# Patient Record
Sex: Female | Born: 1985 | Hispanic: Yes | Marital: Single | Smoking: Never smoker
Health system: Southern US, Community
[De-identification: ages and names within clinical notes are randomized; demographics above are authoritative.]

## PROBLEM LIST (undated history)

## (undated) DIAGNOSIS — Z87442 Personal history of urinary calculi: Secondary | ICD-10-CM

## (undated) DIAGNOSIS — J45909 Unspecified asthma, uncomplicated: Secondary | ICD-10-CM

## (undated) DIAGNOSIS — R10A0 Flank pain, unspecified side: Secondary | ICD-10-CM

## (undated) DIAGNOSIS — Z960 Presence of urogenital implants: Secondary | ICD-10-CM

## (undated) DIAGNOSIS — N201 Calculus of ureter: Secondary | ICD-10-CM

## (undated) DIAGNOSIS — Z973 Presence of spectacles and contact lenses: Secondary | ICD-10-CM

## (undated) DIAGNOSIS — R300 Dysuria: Secondary | ICD-10-CM

## (undated) DIAGNOSIS — F32A Depression, unspecified: Secondary | ICD-10-CM

## (undated) DIAGNOSIS — R109 Unspecified abdominal pain: Secondary | ICD-10-CM

## (undated) HISTORY — PX: TUBAL LIGATION: SHX77

---

## 2014-10-09 ENCOUNTER — Emergency Department (HOSPITAL_COMMUNITY): Payer: Medicaid Other

## 2014-10-09 ENCOUNTER — Emergency Department (HOSPITAL_COMMUNITY)
Admission: EM | Admit: 2014-10-09 | Discharge: 2014-10-09 | Disposition: A | Discharge status: Home / Self Care | Payer: Medicaid Other | Attending: Emergency Medicine | Admitting: Emergency Medicine

## 2014-10-09 ENCOUNTER — Encounter (HOSPITAL_COMMUNITY): Payer: Self-pay | Admitting: Emergency Medicine

## 2014-10-09 DIAGNOSIS — R1110 Vomiting, unspecified: Secondary | ICD-10-CM | POA: Diagnosis not present

## 2014-10-09 DIAGNOSIS — Z3202 Encounter for pregnancy test, result negative: Secondary | ICD-10-CM | POA: Diagnosis not present

## 2014-10-09 DIAGNOSIS — R197 Diarrhea, unspecified: Secondary | ICD-10-CM | POA: Diagnosis not present

## 2014-10-09 DIAGNOSIS — R109 Unspecified abdominal pain: Secondary | ICD-10-CM | POA: Diagnosis not present

## 2014-10-09 DIAGNOSIS — M545 Low back pain: Secondary | ICD-10-CM | POA: Diagnosis not present

## 2014-10-09 DIAGNOSIS — Z87442 Personal history of urinary calculi: Secondary | ICD-10-CM | POA: Insufficient documentation

## 2014-10-09 LAB — CBC WITH DIFFERENTIAL/PLATELET
Basophils Absolute: 0 10*3/uL (ref 0.0–0.1)
Basophils Relative: 0 % (ref 0–1)
Eosinophils Absolute: 0.1 10*3/uL (ref 0.0–0.7)
Eosinophils Relative: 1 % (ref 0–5)
HCT: 41.1 % (ref 36.0–46.0)
Hemoglobin: 13.9 g/dL (ref 12.0–15.0)
Lymphocytes Relative: 31 % (ref 12–46)
Lymphs Abs: 2.1 10*3/uL (ref 0.7–4.0)
MCH: 31.2 pg (ref 26.0–34.0)
MCHC: 33.8 g/dL (ref 30.0–36.0)
MCV: 92.2 fL (ref 78.0–100.0)
Monocytes Absolute: 0.4 10*3/uL (ref 0.1–1.0)
Monocytes Relative: 6 % (ref 3–12)
NEUTROS PCT: 62 % (ref 43–77)
Neutro Abs: 4.3 10*3/uL (ref 1.7–7.7)
PLATELETS: 235 10*3/uL (ref 150–400)
RBC: 4.46 MIL/uL (ref 3.87–5.11)
RDW: 12.2 % (ref 11.5–15.5)
WBC: 7 10*3/uL (ref 4.0–10.5)

## 2014-10-09 LAB — URINALYSIS, ROUTINE W REFLEX MICROSCOPIC
BILIRUBIN URINE: NEGATIVE
GLUCOSE, UA: NEGATIVE mg/dL
Ketones, ur: NEGATIVE mg/dL
Nitrite: NEGATIVE
Protein, ur: NEGATIVE mg/dL
Specific Gravity, Urine: 1.021 (ref 1.005–1.030)
Urobilinogen, UA: 0.2 mg/dL (ref 0.0–1.0)
pH: 5.5 (ref 5.0–8.0)

## 2014-10-09 LAB — COMPREHENSIVE METABOLIC PANEL
ALK PHOS: 63 U/L (ref 39–117)
ALT: 24 U/L (ref 0–35)
AST: 19 U/L (ref 0–37)
Albumin: 4 g/dL (ref 3.5–5.2)
Anion gap: 11 (ref 5–15)
BUN: 12 mg/dL (ref 6–23)
CO2: 25 mEq/L (ref 19–32)
Calcium: 9.3 mg/dL (ref 8.4–10.5)
Chloride: 103 mEq/L (ref 96–112)
Creatinine, Ser: 0.65 mg/dL (ref 0.50–1.10)
GFR calc non Af Amer: >90 mL/min (ref >90)
GLUCOSE: 93 mg/dL (ref 70–99)
POTASSIUM: 4.1 meq/L (ref 3.7–5.3)
SODIUM: 139 meq/L (ref 137–147)
TOTAL PROTEIN: 7.7 g/dL (ref 6.0–8.3)
Total Bilirubin: 0.2 mg/dL — ABNORMAL LOW (ref 0.3–1.2)

## 2014-10-09 LAB — URINE MICROSCOPIC-ADD ON

## 2014-10-09 LAB — POC URINE PREG, ED: Preg Test, Ur: NEGATIVE

## 2014-10-09 MED ORDER — ONDANSETRON 4 MG PO TBDP
8.0000 mg | ORAL_TABLET | Freq: Once | ORAL | Status: AC
  Administered 2014-10-09: 8 mg via ORAL
  Filled 2014-10-09: qty 2

## 2014-10-09 MED ORDER — HYDROMORPHONE HCL 1 MG/ML IJ SOLN
1.0000 mg | Freq: Once | INTRAMUSCULAR | Status: AC
  Administered 2014-10-09: 1 mg via INTRAVENOUS
  Filled 2014-10-09: qty 1

## 2014-10-09 MED ORDER — ONDANSETRON HCL 4 MG/2ML IJ SOLN
4.0000 mg | Freq: Once | INTRAMUSCULAR | Status: AC
  Administered 2014-10-09: 4 mg via INTRAVENOUS
  Filled 2014-10-09: qty 2

## 2014-10-09 MED ORDER — OXYCODONE-ACETAMINOPHEN 5-325 MG PO TABS
1.0000 | ORAL_TABLET | Freq: Once | ORAL | Status: AC
  Administered 2014-10-09: 1 via ORAL
  Filled 2014-10-09: qty 1

## 2014-10-09 MED ORDER — CYCLOBENZAPRINE HCL 10 MG PO TABS: 10.0000 mg | ORAL_TABLET | Freq: Two times a day (BID) | ORAL | Status: DC | PRN

## 2014-10-09 MED ORDER — SODIUM CHLORIDE 0.9 % IV BOLUS (SEPSIS)
1000.0000 mL | Freq: Once | INTRAVENOUS | Status: AC
  Administered 2014-10-09: 1000 mL via INTRAVENOUS

## 2014-10-09 MED ORDER — NAPROXEN 500 MG PO TABS: 500.0000 mg | ORAL_TABLET | Freq: Two times a day (BID) | ORAL | Status: DC

## 2014-10-09 MED ORDER — DEXTROSE 5 % IV SOLN
1.0000 g | Freq: Once | INTRAVENOUS | Status: AC
  Administered 2014-10-09: 1 g via INTRAVENOUS
  Filled 2014-10-09: qty 10

## 2014-10-09 NOTE — ED Notes (Signed)
Attempted lab draw x 2 without success. Called phlebotomy.

## 2014-10-09 NOTE — Discharge Instructions (Signed)
°Emergency Department Resource Guide °1) Find a Doctor and Pay Out of Pocket °Although you won't have to find out who is covered by your insurance plan, it is a good idea to ask around and get recommendations. You will then need to call the office and see if the doctor you have chosen will accept you as a new patient and what types of options they offer for patients who are self-pay. Some doctors offer discounts or will set up payment plans for their patients who do not have insurance, but you will need to ask so you aren't surprised when you get to your appointment. ° °2) Contact Your Local Health Department °Not all health departments have doctors that can see patients for sick visits, but many do, so it is worth a call to see if yours does. If you don't know where your local health department is, you can check in your phone book. The CDC also has a tool to help you locate your state's health department, and many state websites also have listings of all of their local health departments. ° °3) Find a Walk-in Clinic °If your illness is not likely to be very severe or complicated, you may want to try a walk in clinic. These are popping up all over the country in pharmacies, drugstores, and shopping centers. They're usually staffed by nurse practitioners or physician assistants that have been trained to treat common illnesses and complaints. They're usually fairly quick and inexpensive. However, if you have serious medical issues or chronic medical problems, these are probably not your best option. ° °No Primary Care Doctor: °- Call [facility] at  832-8000 - they can help you locate a primary care doctor that  accepts your insurance, provides certain services, etc. °- Physician Referral Service- 1-800-533-3463 ° °Chronic Pain Problems: °Organization         Address  Phone   Notes  °[facility]  (336) 297-2271 Patients need to be referred by their primary care doctor.  ° °Medication  Assistance: °Organization         Address  Phone   Notes  °[place] [facility] [address] °[place], [place] [address] (336) 641-8030 --Must be a resident of [place] °-- Must have NO insurance coverage whatsoever (no Medicaid/ Medicare, etc.) °-- The pt. MUST have a primary care doctor that directs their care regularly and follows them in the community °  °[facility]  (866) 331-1348   °[employer]  (888) 892-1162   ° °Agencies that provide inexpensive medical care: °Organization         Address  Phone   Notes  °[facility]  (336) 832-8035   °[facility]    (336) 832-7272   °[facility] [address] °[place], [place] [address] (336) 832-4777   °[facility] [address], °[place] (336) 271-4999   °[facility]    (336) 373-0678   °[facility]    (336) 272-1050   °[facility] ° [address], [place] Phone:  (336) 832-4444, Fax:  (336) 832-4440 Hours of Operation:  9 am - 6 pm, M-F.  Also accepts Medicaid/Medicare and self-pay.  °[facility] ° [address], [place] Phone: (336) 832-3150, Fax: (336) 832-3151. Hours of Operation:  8:30 am - 5:30 pm, M-F.  Also accepts Medicaid and self-pay.  °[facility] [place] [address], [place] Phone: (336) 878-6027   °[facility] [address], [place], [place] (336)723-1848, Ext. 123 Mondays & Thursdays: 7-9 AM.  First 15 patients are seen on a first come, first serve basis. °  ° °Medicaid-accepting [place] Providers: ° °Organization         Address  Phone   Notes  °[facility] [address], [place] (336) 641-2100 Also accepts self-pay patients.  °[facility] [address], [place] ° (336) 856-9996   °[facility] [address], [place]  (336) 288-8857   °[facility] [address] [place] [address], [place] (336) 299-7000   °Veita Bland [address], [place]  ° (336) 373-1557 Only accepts [place] Access Medicaid patients after they have their name applied to their card.  ° °Self-Pay (no insurance) in [place]: ° °Organization         Address  Phone   Notes  °Sickle Cell Patients, [facility] [address], [place] (336) 832-1970   °[facility] [address], [place] (336) 832-4400   °[facility] ° [address], [place] (336) 992-4800   °[facility]/Dr. Osei-Bonsu ° [address] [place] [address], [place] or [address], [place] (336) 841-8500 Phone number for both [place] and [place] locations is the same.  °[facility] [address], [place] (336) 299-0000   °[facility] [address] [place] [address], [place] or [address] (336) 852-7530 °(336) 878-2260   °[facility] [address], [place] (336) 350-1642, phone; (336) 294-5005, fax Sees patients 1st and 3rd Saturday of every month.  Must not qualify for public or private insurance (i.e. Medicaid, Medicare, [place] Health Choice, Veterans' Benefits) • Household income should be no more than 200% of the poverty level •The clinic cannot treat you if you are pregnant or think you are pregnant • Sexually transmitted diseases are not treated at the clinic.  ° ° °Dental Care: °Organization         Address  Phone  Notes  °[place] [facility] [address], [place] (336) 641-6152 Accepts children up to age 21 who are enrolled in Medicaid or [place] Health Choice; pregnant women with a Medicaid card; and children who have applied for Medicaid or [place] Health Choice, but were declined, whose parents can pay a reduced fee at time of service.  °[place]  [facility] [place]  [address], [place] (336) 641-7733 Accepts children up to age 21 who are enrolled in Medicaid or [place] Health Choice; pregnant women with a Medicaid card; and children who have applied for Medicaid or [place] Health Choice, but were declined, whose parents can pay a reduced fee at time of service.  °[facility] ° [address], [place] (336) 641-4533 Patients are seen by appointment only. Walk-ins are not accepted. [facility] will see patients 18 years of age and older. °Monday - Tuesday (8am-5pm) °Most Wednesdays (8:30-5pm) °$30 per visit, cash only  °[facility] ° [address], [place] (336) 641-4533 Patients are seen by appointment only. Walk-ins are not accepted. [facility] will see patients 18 years of age and older. °One   Wednesday Evening (Monthly: Volunteer Based).  $30 per visit, cash only  °[facility]  (919) 537-3737 for adults; Children under age 4, call Graduate Pediatric Dentistry at (919) [phone]. Children aged 4-14, please call (919) [phone] to request a pediatric application. ° Dental services are provided in all areas of dental care including fillings, crowns and bridges, complete and partial dentures, implants, gum treatment, root canals, and extractions. Preventive care is also provided. Treatment is provided to both adults and children. °Patients are selected via a lottery and there is often a waiting list. °  °[facility] [address], °[place] ° (336) 763-8833 [email or web address] °  °[facility] [address], [place], [place] (336)723-1848, Ext. 123 Second and Fourth Thursday of each month, opens at 6:30 AM; Clinic ends at 9 AM.  Patients are seen on a first-come first-served basis, and a limited number are seen during each clinic.  ° °[facility] ° [address], [place], [place] (336) 723-7904    Eligibility Requirements °You must have lived in [place], [place], or [place] counties for at least the last three months. °  You cannot be eligible for state or federal sponsored healthcare insurance, including Veterans Administration, Medicaid, or Medicare. °  You generally cannot be eligible for healthcare insurance through your employer.  °  How to apply: °Eligibility screenings are held every Tuesday and Wednesday afternoon from 1:00 pm until 4:00 pm. You do not need an appointment for the interview!  °[facility] [address], [place], [place] 336-631-2330   °[facility]  336-342-8273   °[facility]  336-703-3100   °[facility]  336-570-6415   ° °Behavioral Health Resources in the Community: °Intensive Outpatient Programs °Organization         Address  Phone  Notes  °[facility] [address], [place], [place] 336-878-6098   °[facility] [address], [place], [place] 336-832-9800   °[facility] [address], [place], [place] ° 336-882-2125   °[facility] [address],  °[place], [place] 1-800-853-5163 or 336-641-4981   °Substance Abuse Resources °Organization         Address  Phone  Notes  °[facility]  336-882-2125   °[facility]  336-784-9470   °[facility]  336-285-9073   °[facility]  336-845-3988   °Residential & Outpatient Substance Abuse Program  1-800-659-3381   °Psychological Services °Organization         Address  Phone  Notes  °[facility]  336- 832-9600   °[facility]  336- 378-7881   °[facility] [address], [place] 1-800-853-5163 or 336-641-4981   ° °Mobile Crisis Teams °Organization         Address  Phone  Notes  °[facility], Mobile Crisis Care Unit  1-877-626-1772   °Assertive °[facility] ° [address]  [place], [place] 336-834-9664   °Sharon DeEsch [address] °[place] 336-554-5454   ° °Self-Help/Support Groups °Organization         Address  Phone             Notes  °[facility]  - variety of support groups  336- 373-1402 Call for more information  °Narcotics Anonymous (NA), [facility] [address], °[place]  2 meetings at this location  ° °  Residential Treatment Programs Organization         Address  Phone  Notes  [facility] [address],    [place]  [phone]   [facility]  [address], [place] [id number], [place], [place] [phone]   [facility] [address], [place] [phone] Admissions: 8am-3pm M-F  [facility] [address]    [place], [place] [phone](743) [phone]   [facility] [address], [place], [place] [phone]   [facility] [address]  [place], [place] [phone]   [facility] Intensive Outpatient [address], [place], [place] [phone]   [facility] [address]  [place], [place] [phone] or (310)330-9328(573) [phone]   [facility] [address] [place], [place] [phone] Accepts Medicaid  [facility] [address]  [place] [phone] Substance Abuse/Addiction Treatment   [facility] Organization         Address  Phone  Notes  [facility]  657-397-8879(888) [phone]   Angie FavaJulie Brannon, PhD [address], [address] [place], [place]   [phone](336) (201) [phone] or (260)[phone](336) ([phone]   [facility]   [address] [place], [place] [phone](336) [phone]   [facility] [address], [place], [place] [phone](336) (925) [phone] Insurance/Medicaid/sponsorship through [facility]  [facility] [address]                                    [place], [place] ([phone](336) ([phone] Therapy/tele-psych/case    [facility] [address]   [place], [place] [phone](336) [phone]    Dr. Lolly MustacheArfeen  [phone]) [phone]   [facility] [place]  [employer] [facility] 1) [address], [place] 2) [address], [place] 3)  [address], [place] [phone](336) [phone](336) [phone]  [phone](336) [phone]   [facility] Child Abuse Hot[facility] 867-024-3576(336) 202-882-0322 or [phone](336) [phone] (After Hours)      Your evaluation of back pain today in the emergency department showed no evidence of an emergent cause of your pain. There is no evidence of kidney stone, broken bones or dislocations. He may follow up with your primary care for further evaluation and management of your back pain

## 2014-10-09 NOTE — ED Provider Notes (Signed)
Patient care transferred to me by Trixie DredgeEmily West, PA-C Plan is to DC with pain symptom relief pending results of CT abdomen.  Patient's CT abdomen shows no evidence of obstructing stone or pyelonephritis. Based on inciting event and story, patient's pain symptomology likely musculoskeletal in etiology. Will DC with pain medicine, muscle relaxer. May follow-up outpatient with primary care for further evaluation and management. Pt can ambulate independently with antalgic gait but no apparent ataxia. Laboratory noncontributory Patient is stable, in good condition and is appropriate for discharge Prior to patient discharge, I discussed and reviewed this case with Dr.Linker     Sharlene MottsBenjamin W Davius Goudeau, PA-C 10/10/14 [phone]

## 2014-10-09 NOTE — ED Notes (Addendum)
Pt reports she vomited. Translator not at bedside. Pt up to bathroom per w/c. Pt appears to be very uncomfortable.

## 2014-10-09 NOTE — ED Provider Notes (Signed)
CSN: [id number]     Arrival date & time 10/09/14  [id number] History  This chart was scribed for non-physician practitioner Trixie DredgeEmily Leinani Lisbon, PA-C  working with Doug SouSam Jacubowitz, MD by Leone PayorSonum Patel, ED Scribe. This patient was seen in room TR11C/TR11C and the patient's care was started at 10:26 AM.    No chief complaint on file.  The history is provided by the patient. No language interpreter was used.    HPI Comments: Jill Riley is a 28 y.o. female who presents to the Emergency Department complaining of 1 day of constant low back pain that radiates to the bilateral hips. She reports associated right lateral side pain along with episodes of vomiting and diarrhea. She denies recent injury or heavy lifting.  Does note the pain began while she was mopping.She denies fever.  Denies urinary or vaginal symptoms. LMP Oct 22.   She does have a hx kidney stones.  Denies current vaginal bleeding.  Pt has similar symptoms [place] ago [place], had pain for 2 weeks and it resolved.    History reviewed. No pertinent past medical history. History reviewed. No pertinent past surgical history. No family history on file. History  Substance Use Topics  . Smoking status: Never Smoker   . Smokeless tobacco: Not on file  . Alcohol Use: No   OB History   Grav Para Term Preterm Abortions TAB SAB Ect Mult Living                 Review of Systems  All other systems reviewed and are negative.     Allergies  Review of patient's allergies indicates no known allergies.  Home Medications   Prior to Admission medications   Not on File   BP 112/61  Pulse 89  Temp(Src) 98.4 F (36.9 C) (Oral)  SpO2 100%  LMP 10/05/2014 Physical Exam  Nursing note and vitals reviewed. Constitutional: She appears well-developed and well-nourished. No distress.  HENT:  Head: Normocephalic and atraumatic.  Neck: Neck supple.  Cardiovascular: Normal rate and regular rhythm.   Pulmonary/Chest: Effort normal and  breath sounds normal. No respiratory distress. She has no wheezes. She has no rales.  Abdominal: Soft. She exhibits no distension. There is no tenderness. There is no rebound, no guarding and no CVA tenderness.  Musculoskeletal:       Arms: Neurological: She is alert.  Skin: She is not diaphoretic.    ED Course  Procedures (including critical care time)  DIAGNOSTIC STUDIES: Oxygen Saturation is 100% on RA, normal by my interpretation.    COORDINATION OF CARE: 10:26 AM Discussed treatment plan with pt at bedside and pt agreed to plan.   Labs Review Labs Reviewed  COMPREHENSIVE METABOLIC PANEL - Abnormal; Notable for the following:    Total Bilirubin 0.2 (*)    All other components within normal limits  URINALYSIS, ROUTINE W REFLEX MICROSCOPIC - Abnormal; Notable for the following:    APPearance HAZY (*)    Hgb urine dipstick SMALL (*)    Leukocytes, UA MODERATE (*)    All other components within normal limits  URINE MICROSCOPIC-ADD ON - Abnormal; Notable for the following:    Squamous Epithelial / LPF MANY (*)    Bacteria, UA FEW (*)    All other components within normal limits  URINE CULTURE  CBC WITH DIFFERENTIAL  POC URINE PREG, ED    Imaging Review No results found.   EKG Interpretation None      1:01 PM Signed out  to LandAmerica FinancialBen Cartner, PA-C as patient moved back to main ED.  Pending pain control, CT abd/pelvis.  Suspect muscle strain vs pyelo vs kidney stone.    MDM   Final diagnoses:  Right flank pain      I personally performed the services described in this documentation, which was scribed in my presence. The recorded information has been reviewed and is accurate.   Trixie Dredgemily Emiley Digiacomo, PA-C 10/09/14 1307

## 2014-10-09 NOTE — ED Provider Notes (Signed)
Medical screening examination/treatment/procedure(s) were performed by non-physician practitioner and as supervising physician I was immediately available for consultation/collaboration.   EKG Interpretation None       Doug SouSam Christino Mcglinchey, MD 10/09/14 1725

## 2014-10-09 NOTE — ED Provider Notes (Signed)
Medical Screening Exam  Pt complaining of 1 day of constant low back pain that radiates to the bilateral hips. She reports associated right flank pain along with episodes of vomiting and diarrhea.Does note some tenderness along right abdomen. She denies recent falls or heavy lifting, pain did begin while mopping.   Pain is preventing her from standing upright and walking comfortably.     Pt triaged to fast track.  As she is having right flank pain and N/V/D, will move to main ED.  Ordered labs, urine.    Trixie Dredgemily Priscilla Kirstein, PA-C 10/09/14 1035

## 2014-10-09 NOTE — ED Provider Notes (Signed)
Medical screening examination/treatment/procedure(s) were performed by non-physician practitioner and as supervising physician I was immediately available for consultation/collaboration.   EKG Interpretation None       Ros[place] Braun, MD 10/09/14 1726 

## 2014-10-09 NOTE — ED Notes (Signed)
Pt here from [place] 3 weeks ago. Today c/o LBP. States is unable to walk due to pain. Hx of same 9 months ago. Had neg xray per pt. Denies N/V or vaginal discharge. Pt does not speak [place]. Family member at bedside that does.

## 2014-10-10 LAB — URINE CULTURE: Colony Count: 30000

## 2014-10-11 NOTE — ED Provider Notes (Signed)
Medical screening examination/treatment/procedure(s) were performed by non-physician practitioner and as supervising physician I was immediately available for consultation/collaboration.   EKG Interpretation None       Nandan Willems K Linker, MD 10/11/14 0803 

## 2016-07-08 ENCOUNTER — Encounter (HOSPITAL_COMMUNITY): Payer: Self-pay | Admitting: Emergency Medicine

## 2016-07-08 ENCOUNTER — Emergency Department (HOSPITAL_COMMUNITY)
Admission: EM | Admit: 2016-07-08 | Discharge: 2016-07-09 | Disposition: A | Discharge status: Home / Self Care | Payer: Medicaid Other | Attending: Emergency Medicine | Admitting: Emergency Medicine

## 2016-07-08 DIAGNOSIS — M545 Low back pain: Secondary | ICD-10-CM | POA: Insufficient documentation

## 2016-07-08 DIAGNOSIS — R1030 Lower abdominal pain, unspecified: Secondary | ICD-10-CM | POA: Diagnosis not present

## 2016-07-08 DIAGNOSIS — N390 Urinary tract infection, site not specified: Secondary | ICD-10-CM | POA: Insufficient documentation

## 2016-07-08 LAB — URINALYSIS, ROUTINE W REFLEX MICROSCOPIC
BILIRUBIN URINE: NEGATIVE
Glucose, UA: NEGATIVE mg/dL
KETONES UR: NEGATIVE mg/dL
NITRITE: NEGATIVE
Protein, ur: NEGATIVE mg/dL
Specific Gravity, Urine: 1.025 (ref 1.005–1.030)
pH: 5.5 (ref 5.0–8.0)

## 2016-07-08 LAB — COMPREHENSIVE METABOLIC PANEL
ALK PHOS: 56 U/L (ref 38–126)
ALT: 33 U/L (ref 14–54)
AST: 28 U/L (ref 15–41)
Albumin: 4.1 g/dL (ref 3.5–5.0)
Anion gap: 3 — ABNORMAL LOW (ref 5–15)
BILIRUBIN TOTAL: 0.7 mg/dL (ref 0.3–1.2)
BUN: 12 mg/dL (ref 6–20)
CO2: 25 mmol/L (ref 22–32)
CREATININE: 0.85 mg/dL (ref 0.44–1.00)
Calcium: 9.3 mg/dL (ref 8.9–10.3)
Chloride: 107 mmol/L (ref 101–111)
GFR calc Af Amer: >60 mL/min (ref >60)
GFR calc non Af Amer: >60 mL/min (ref >60)
Glucose, Bld: 92 mg/dL (ref 65–99)
POTASSIUM: 3.9 mmol/L (ref 3.5–5.1)
Sodium: 135 mmol/L (ref 135–145)
TOTAL PROTEIN: 7.2 g/dL (ref 6.5–8.1)

## 2016-07-08 LAB — URINE MICROSCOPIC-ADD ON

## 2016-07-08 LAB — CBC
HCT: 43.3 % (ref 36.0–46.0)
Hemoglobin: 15 g/dL (ref 12.0–15.0)
MCH: 31.9 pg (ref 26.0–34.0)
MCHC: 34.6 g/dL (ref 30.0–36.0)
MCV: 92.1 fL (ref 78.0–100.0)
Platelets: 250 10*3/uL (ref 150–400)
RBC: 4.7 MIL/uL (ref 3.87–5.11)
RDW: 11.9 % (ref 11.5–15.5)
WBC: 8.5 10*3/uL (ref 4.0–10.5)

## 2016-07-08 LAB — I-STAT BETA HCG BLOOD, ED (MC, WL, AP ONLY): I-stat hCG, quantitative: <5 m[IU]/mL (ref <5)

## 2016-07-08 LAB — LIPASE, BLOOD: Lipase: 22 U/L (ref 11–51)

## 2016-07-08 MED ORDER — CYCLOBENZAPRINE HCL 10 MG PO TABS: 10.0000 mg | ORAL_TABLET | Freq: Two times a day (BID) | ORAL | 0 refills | Status: DC | PRN

## 2016-07-08 MED ORDER — NITROFURANTOIN MONOHYD MACRO 100 MG PO CAPS: 100.0000 mg | ORAL_CAPSULE | Freq: Two times a day (BID) | ORAL | 0 refills | Status: DC

## 2016-07-08 MED ORDER — KETOROLAC TROMETHAMINE 60 MG/2ML IM SOLN
30.0000 mg | Freq: Once | INTRAMUSCULAR | Status: AC
  Administered 2016-07-08: 30 mg via INTRAMUSCULAR
  Filled 2016-07-08: qty 2

## 2016-07-08 MED ORDER — NAPROXEN 500 MG PO TABS: 500.0000 mg | ORAL_TABLET | Freq: Two times a day (BID) | ORAL | 0 refills | Status: DC

## 2016-07-08 MED ORDER — DEXAMETHASONE SODIUM PHOSPHATE 10 MG/ML IJ SOLN
10.0000 mg | Freq: Once | INTRAMUSCULAR | Status: AC
  Administered 2016-07-08: 10 mg via INTRAMUSCULAR
  Filled 2016-07-08: qty 1

## 2016-07-08 NOTE — ED Triage Notes (Signed)
Pt sts left sided flank and back pain x 3 days; pt denies other symptoms at present; pt sts LMP was 6/27

## 2016-07-08 NOTE — ED Provider Notes (Signed)
[facility]-EMERGENCY DEPT Provider Note   CSN: [id number] Arrival date & time: 07/08/16  8295  First Provider Contact:  First MD Initiated Contact with Patient 07/08/16 2225        History   Chief Complaint Chief Complaint  Patient presents with  . Abdominal Pain    HPI Jill Riley is a 30 y.o. female.  The history is provided by the patient. A language interpreter was used.     Patient is a 30 year old female with history of chronic back pain who presents with 3 days of worsening left-sided lower back pain. Patient states this pain is similar to episodes she's had in the past. Pain in the left-sided lower back radiates into her lateral left hip and thigh. Pain is sharp 6/10 at rest and worse with movement, 8/10. She isn't taking anything for the pain. She is also having left flank pain that feels like menstrual cramps, relieved with urination. Associated nausea and decreased urine. Patient denies fever, chills, dysuria, increased frequency, vaginal discharge, disappearing area, history of STDs, headache, dizziness, numbness/timing, weakness, loss of bowel bladder function.  History reviewed. No pertinent past medical history.  There are no active problems to display for this patient.   Past Surgical History:  Procedure Laterality Date  . TUBAL LIGATION      OB History    No data available       Home Medications    Prior to Admission medications   Medication Sig Start Date End Date Taking? Authorizing Provider  cyclobenzaprine (FLEXERIL) 10 MG tablet Take 1 tablet (10 mg total) by mouth 2 (two) times daily as needed for muscle spasms. 07/08/16   Jerre Simon, PA  naproxen (NAPROSYN) 500 MG tablet Take 1 tablet (500 mg total) by mouth 2 (two) times daily. 07/08/16   Jerre Simon, PA  nitrofurantoin, macrocrystal-monohydrate, (MACROBID) 100 MG capsule Take 1 capsule (100 mg total) by mouth 2 (two) times daily. 07/08/16   Jerre Simon, PA    Family  History History reviewed. No pertinent family history.  Social History Social History  Substance Use Topics  . Smoking status: Never Smoker  . Smokeless tobacco: Not on file  . Alcohol use No     Allergies   Review of patient's allergies indicates no known allergies.   Review of Systems Review of Systems  Constitutional: Negative for chills and fever.  Respiratory: Negative for shortness of breath.   Cardiovascular: Negative for chest pain and leg swelling.  Gastrointestinal: Positive for nausea. Negative for abdominal pain, diarrhea and vomiting.  Genitourinary: Positive for decreased urine volume and flank pain. Negative for difficulty urinating, dyspareunia, dysuria, frequency and vaginal discharge.  Musculoskeletal: Positive for back pain. Negative for joint swelling and neck pain.  Skin: Negative for rash.  Neurological: Negative for dizziness, syncope, weakness, numbness and headaches.     Physical Exam Updated Vital Signs BP 126/80 (BP Location: Left Arm)   Pulse 72   Temp 98.5 F (36.9 C) (Oral)   Resp 26   SpO2 99%   Physical Exam  Constitutional: She appears well-developed and well-nourished. No distress.  HENT:  Head: Normocephalic and atraumatic.  Eyes: Conjunctivae are normal.  Neck: Normal range of motion.  Cardiovascular: Normal rate, regular rhythm and normal heart sounds.  Exam reveals no gallop and no friction rub.   No murmur heard. Pulses:      Dorsalis pedis pulses are 2+ on the right side, and 2+ on the left side.  Posterior tibial pulses are 2+ on the right side, and 2+ on the left side.  Pulmonary/Chest: Effort normal and breath sounds normal. No respiratory distress. She has no wheezes. She has no rales.  Abdominal: Soft. Normal appearance. She exhibits no distension. There is tenderness in the suprapubic area. There is no CVA tenderness.  Musculoskeletal: She exhibits no edema.       Lumbar back: Normal. She exhibits normal range of  motion, no swelling, no edema and no deformity.  TTP to the paraspinal muscles of lumbar spine, greater on the left, TTP of the left sided SI joint, no TTP to the lumbar spine. Strength 5/5 of BLE including plantar flexion and extension, no pain with straight leg raise, sensation intact to light touch, patient neurovascularly intact distally.  Neurological: She is alert.  Skin: Skin is warm and dry. No rash noted. She is not diaphoretic.  Psychiatric: She has a normal mood and affect. Her behavior is normal.  Nursing note and vitals reviewed.     ED Treatments / Results  Labs (all labs ordered are listed, but only abnormal results are displayed) Labs Reviewed  COMPREHENSIVE METABOLIC PANEL - Abnormal; Notable for the following:       Result Value   Anion gap 3 (*)    All other components within normal limits  URINALYSIS, ROUTINE W REFLEX MICROSCOPIC (NOT AT [facility]) - Abnormal; Notable for the following:    APPearance TURBID (*)    Hgb urine dipstick MODERATE (*)    Leukocytes, UA LARGE (*)    All other components within normal limits  URINE MICROSCOPIC-ADD ON - Abnormal; Notable for the following:    Squamous Epithelial / LPF 6-30 (*)    Bacteria, UA MANY (*)    All other components within normal limits  LIPASE, BLOOD  CBC  I-STAT BETA HCG BLOOD, ED ([facility], [facility], [facility] ONLY)    EKG  EKG Interpretation None       Radiology No results found.  Procedures Procedures (including critical care time)  Medications Ordered in ED Medications  dexamethasone (DECADRON) injection 10 mg (not administered)  ketorolac (TORADOL) injection 30 mg (not administered)     Initial Impression / Assessment and Plan / ED Course  I have reviewed the triage vital signs and the nursing notes.  Pertinent labs & imaging results that were available during my care of the patient were reviewed by me and considered in my medical decision making (see chart for details).  Clinical Course    Final  Clinical Impressions(s) / ED Diagnoses   Final diagnoses:  Bilateral low back pain, with sciatica presence unspecified  UTI (lower urinary tract infection)    Patient with back pain similar to episodes in the past.  No neurological deficits and normal neuro exam.  Patient can walk but states is painful.  No loss of bowel or bladder control.  No concern for cauda equina.  No fever, night sweats, weight loss, h/o cancer, IVDU.  RICE protocol and pain medicine indicated and discussed with patient. Pt given IM toradol in the ED and d/c with pain meds and flexeril.   Pt has been diagnosed with a UTI. Pt is afebrile, no CVA tenderness, normotensive, and denies V. Pt to be dc home with antibiotics and instructions to follow up with PCP if symptoms persist. Urine culture pending.   Discussed strict return precautions. Pt expressed understanding to the discharge instructions.    New Prescriptions New Prescriptions   NITROFURANTOIN, MACROCRYSTAL-MONOHYDRATE, (MACROBID) 100  MG CAPSULE    Take 1 capsule (100 mg total) by mouth 2 (two) times daily.     Jerre Simon, PA 07/09/16 0011    Rolan Bucco, MD 07/09/16 [phone]

## 2016-07-08 NOTE — Discharge Instructions (Signed)
Obtain a primary care provider and follow up with them regarding your chronic back pain. Take the medications as prescribed. Be sure to eat when taking the naproxen as this can be hard on your stomach. Do not drive or operate machinery while on the Flexeril. Do not drink alcohol while taking Flexeril as it can cause drowsiness. Return to the emergency department if you experience worsening back pain, fevers, numbness/tingling of your legs, loss of bowel or bladder function or any other concerning symptoms.

## 2016-07-08 NOTE — ED Notes (Addendum)
PA at bedside with portable spanish interpreter in use.

## 2016-07-09 NOTE — ED Notes (Signed)
Pt verbalized understanding of discharge instructions and follow-up care. No further questions at this time. 

## 2017-04-10 ENCOUNTER — Encounter (HOSPITAL_COMMUNITY): Payer: Self-pay | Admitting: Emergency Medicine

## 2017-04-10 ENCOUNTER — Emergency Department (HOSPITAL_COMMUNITY)
Admission: EM | Admit: 2017-04-10 | Discharge: 2017-04-10 | Disposition: A | Discharge status: Home / Self Care | Payer: Medicaid Other | Attending: Emergency Medicine | Admitting: Emergency Medicine

## 2017-04-10 DIAGNOSIS — Z5321 Procedure and treatment not carried out due to patient leaving prior to being seen by health care provider: Secondary | ICD-10-CM | POA: Insufficient documentation

## 2017-04-10 DIAGNOSIS — R109 Unspecified abdominal pain: Secondary | ICD-10-CM | POA: Insufficient documentation

## 2017-04-10 LAB — CBC
HEMATOCRIT: 39.3 % (ref 36.0–46.0)
Hemoglobin: 13.7 g/dL (ref 12.0–15.0)
MCH: 31.1 pg (ref 26.0–34.0)
MCHC: 34.9 g/dL (ref 30.0–36.0)
MCV: 89.3 fL (ref 78.0–100.0)
Platelets: 231 10*3/uL (ref 150–400)
RBC: 4.4 MIL/uL (ref 3.87–5.11)
RDW: 12 % (ref 11.5–15.5)
WBC: 7.7 10*3/uL (ref 4.0–10.5)

## 2017-04-10 LAB — URINALYSIS, ROUTINE W REFLEX MICROSCOPIC
Bilirubin Urine: NEGATIVE
GLUCOSE, UA: NEGATIVE mg/dL
Ketones, ur: NEGATIVE mg/dL
NITRITE: NEGATIVE
PROTEIN: NEGATIVE mg/dL
Specific Gravity, Urine: 1.013 (ref 1.005–1.030)
pH: 5 (ref 5.0–8.0)

## 2017-04-10 LAB — BASIC METABOLIC PANEL
ANION GAP: 8 (ref 5–15)
BUN: 9 mg/dL (ref 6–20)
CO2: 22 mmol/L (ref 22–32)
CREATININE: 0.59 mg/dL (ref 0.44–1.00)
Calcium: 9 mg/dL (ref 8.9–10.3)
Chloride: 104 mmol/L (ref 101–111)
GFR calc non Af Amer: >60 mL/min (ref >60)
Glucose, Bld: 86 mg/dL (ref 65–99)
Potassium: 4.2 mmol/L (ref 3.5–5.1)
Sodium: 134 mmol/L — ABNORMAL LOW (ref 135–145)

## 2017-04-10 LAB — I-STAT BETA HCG BLOOD, ED (MC, WL, AP ONLY): I-stat hCG, quantitative: <5 m[IU]/mL (ref <5)

## 2017-04-10 NOTE — ED Triage Notes (Signed)
Spanish interpretor utilized for triage. Pt reports left sided flank pain x1 year denies any other associated symptoms.

## 2017-04-10 NOTE — ED Notes (Signed)
Called for room without answer.  

## 2017-04-11 ENCOUNTER — Encounter (HOSPITAL_BASED_OUTPATIENT_CLINIC_OR_DEPARTMENT_OTHER): Payer: Self-pay | Admitting: Emergency Medicine

## 2017-04-11 ENCOUNTER — Emergency Department (HOSPITAL_BASED_OUTPATIENT_CLINIC_OR_DEPARTMENT_OTHER)
Admission: EM | Admit: 2017-04-11 | Discharge: 2017-04-11 | Disposition: A | Discharge status: Home / Self Care | Payer: Worker's Compensation | Attending: Emergency Medicine | Admitting: Emergency Medicine

## 2017-04-11 DIAGNOSIS — Y990 Civilian activity done for income or pay: Secondary | ICD-10-CM | POA: Diagnosis not present

## 2017-04-11 DIAGNOSIS — W260XXA Contact with knife, initial encounter: Secondary | ICD-10-CM | POA: Diagnosis not present

## 2017-04-11 DIAGNOSIS — S6992XA Unspecified injury of left wrist, hand and finger(s), initial encounter: Secondary | ICD-10-CM | POA: Diagnosis present

## 2017-04-11 DIAGNOSIS — Y929 Unspecified place or not applicable: Secondary | ICD-10-CM | POA: Diagnosis not present

## 2017-04-11 DIAGNOSIS — S61012A Laceration without foreign body of left thumb without damage to nail, initial encounter: Secondary | ICD-10-CM

## 2017-04-11 DIAGNOSIS — S66222A Laceration of extensor muscle, fascia and tendon of left thumb at wrist and hand level, initial encounter: Secondary | ICD-10-CM | POA: Insufficient documentation

## 2017-04-11 DIAGNOSIS — Y9389 Activity, other specified: Secondary | ICD-10-CM | POA: Insufficient documentation

## 2017-04-11 DIAGNOSIS — Z23 Encounter for immunization: Secondary | ICD-10-CM | POA: Diagnosis not present

## 2017-04-11 MED ORDER — TETANUS-DIPHTH-ACELL PERTUSSIS 5-2.5-18.5 LF-MCG/0.5 IM SUSP
0.5000 mL | Freq: Once | INTRAMUSCULAR | Status: AC
  Administered 2017-04-11: 0.5 mL via INTRAMUSCULAR
  Filled 2017-04-11: qty 0.5

## 2017-04-11 MED ORDER — LIDOCAINE HCL (PF) 1 % IJ SOLN
INTRAMUSCULAR | Status: AC
  Administered 2017-04-11: 5 mL via INTRADERMAL
  Filled 2017-04-11: qty 5

## 2017-04-11 MED ORDER — LIDOCAINE HCL (PF) 1 % IJ SOLN
5.0000 mL | Freq: Once | INTRAMUSCULAR | Status: AC
  Administered 2017-04-11: 5 mL via INTRADERMAL

## 2017-04-11 NOTE — ED Provider Notes (Signed)
[facility]   CSN: [id number] Arrival date & time: 04/11/17  0240     History   Chief Complaint Chief Complaint  Patient presents with  . Extremity Laceration    HPI Jill Riley is a 31 y.o. female.  The history is provided by the patient and a friend. A language interpreter was used [phone]).  Laceration   Incident onset: just prior to arrival. Pain location: left thumb. The laceration is 2 cm in size. Injury mechanism: knife. The pain is mild. The pain has been constant since onset. Her tetanus status is unknown.  pt comes from work where she sustained laceration to left thumb No other injuries or complaints reported   PMH - none Past Surgical History:  Procedure Laterality Date  . TUBAL LIGATION      OB History    No data available       Home Medications    Prior to Admission medications   Medication Sig Start Date End Date Taking? Authorizing Provider  cyclobenzaprine (FLEXERIL) 10 MG tablet Take 1 tablet (10 mg total) by mouth 2 (two) times daily as needed for muscle spasms. 07/08/16   Jerre Simon, PA  naproxen (NAPROSYN) 500 MG tablet Take 1 tablet (500 mg total) by mouth 2 (two) times daily. 07/08/16   Jerre Simon, PA  nitrofurantoin, macrocrystal-monohydrate, (MACROBID) 100 MG capsule Take 1 capsule (100 mg total) by mouth 2 (two) times daily. 07/08/16   Jerre Simon, PA    Family History No family history on file.  Social History Social History  Substance Use Topics  . Smoking status: Never Smoker  . Smokeless tobacco: Never Used  . Alcohol use No     Allergies   Patient has no known allergies.   Review of Systems Review of Systems  Constitutional: Negative for fever.  Skin: Positive for wound.     Physical Exam Updated Vital Signs BP (!) 144/87 (BP Location: Right Arm)   Pulse 96   Temp 98.2 F (36.8 C) (Oral)   Resp 16   SpO2 100%   Physical Exam CONSTITUTIONAL: Well developed/well  nourished HEAD: Normocephalic/atraumatic ENMT: Mucous membranes moist NECK: supple no meningeal signs LUNGS:  no apparent distress NEURO: Pt is awake/alert/appropriate, moves all extremitiesx4.  No facial droop.   EXTREMITIES: pulses normal/equal, full ROM, clean 2 cm laceration to extensor surface/proximal aspect of left thumb. No foreign body noted.  Extensor Tendon is visualized and appears lacerated She has full range of motion of left thumb with flexion/extension and equal strength with movement of left thumb Wound is hemostatic SKIN: warm, color normal, laceration noted PSYCH: no abnormalities of mood noted, alert and oriented to situation   ED Treatments / Results  Labs (all labs ordered are listed, but only abnormal results are displayed) Labs Reviewed - No data to display  EKG  EKG Interpretation None       Radiology No results found.  Procedures Procedures  LACERATION REPAIR Performed by: Joya Gaskins Consent: Verbal consent obtained Prepped and Draped in normal sterile fashion Wound explored Laceration Location: left thumb - extensor surface Laceration Length: 2cm No Foreign Bodies seen or palpated Anesthesia: local infiltration Local anesthetic: lidocaine % without epinephrine Anesthetic total: 5 ml Irrigation method: syringe Amount of cleaning: standard Skin closure: simple Number of sutures or staples: 3 prolene Technique: interrupted Patient tolerance: Patient tolerated the procedure well with no immediate complications.  SPLINT APPLICATION Date/Time: 04/11/17 Authorized by: Joya Gaskins Consent: Verbal consent  obtained. Risks and benefits: risks, benefits and alternatives were discussed Consent given by: patient Splint applied by: orthopedic technician Location details: left upper extremity Splint type: thumb spica Supplies used: ortho glass Post-procedure: The splinted body part was neurovascularly unchanged following the  procedure. Patient tolerance: Patient tolerated the procedure well with no immediate complications.     Medications Ordered in ED Medications  Tdap (BOOSTRIX) injection 0.5 mL (0.5 mLs Intramuscular Given 04/11/17 0321)  lidocaine (PF) (XYLOCAINE) 1 % injection 5 mL (5 mLs Intradermal Given by Other 04/11/17 4696)     Initial Impression / Assessment and Plan / ED Course  I have reviewed the triage vital signs and the nursing notes.       Pt appears to have at least partial extensor tendon laceration to left thumb, however she does not appear to have any weakness with extension of thumb I loosely closed wound, and applied thumb spica I explained at length via spanish interpreter (iPad version) need to avoid work, use splint and close followup with hand specialist for re-examination and potential tendon repair Wound is clean, no foreign bodies noted Pt agreeable with plan  Final Clinical Impressions(s) / ED Diagnoses   Final diagnoses:  Laceration of left thumb without foreign body without damage to nail, initial encounter  Laceration of left thumb with tendon involvement, initial encounter    New Prescriptions New Prescriptions   No medications on file     Zadie Rhine, MD 04/11/17 [phone]

## 2017-04-11 NOTE — ED Notes (Signed)
MD at bedside with Ipad interpreter to explain instructions to pt.

## 2017-04-11 NOTE — ED Triage Notes (Addendum)
Pt has laceration to base of left thumb from a knife while at work tonight. Pt has friend with her interpreting.

## 2017-04-11 NOTE — ED Notes (Signed)
Pt tolerated well. PMS intact before and after. All questions answered. 

## 2017-04-13 ENCOUNTER — Emergency Department (HOSPITAL_BASED_OUTPATIENT_CLINIC_OR_DEPARTMENT_OTHER)
Admission: EM | Admit: 2017-04-13 | Discharge: 2017-04-13 | Disposition: A | Discharge status: Home / Self Care | Payer: Self-pay | Attending: Emergency Medicine | Admitting: Emergency Medicine

## 2017-04-13 ENCOUNTER — Encounter (HOSPITAL_BASED_OUTPATIENT_CLINIC_OR_DEPARTMENT_OTHER): Payer: Self-pay

## 2017-04-13 DIAGNOSIS — Z5189 Encounter for other specified aftercare: Secondary | ICD-10-CM

## 2017-04-13 DIAGNOSIS — I10 Essential (primary) hypertension: Secondary | ICD-10-CM | POA: Insufficient documentation

## 2017-04-13 DIAGNOSIS — X58XXXD Exposure to other specified factors, subsequent encounter: Secondary | ICD-10-CM | POA: Insufficient documentation

## 2017-04-13 DIAGNOSIS — S61012D Laceration without foreign body of left thumb without damage to nail, subsequent encounter: Secondary | ICD-10-CM | POA: Insufficient documentation

## 2017-04-13 NOTE — ED Notes (Signed)
EMT at [facility] applying splint

## 2017-04-13 NOTE — ED Triage Notes (Signed)
Pt requesting recheck of left UE splint that was placed here 4/28-also states she did not receive RTW note-r/t to [facility] claim-staff interpreter used in triage-interpreter line to be used in tx area for complete confirmation-pt NAD-steady gait

## 2017-04-13 NOTE — ED Notes (Signed)
Splint removed, site/ wound assessed. Closed laceration approximated well, sutures intact, wound healing/ healthy, no s/sx of infection. No drainage. Minimal swelling present. CMS intact. Pulses strong. Cap refill <2sec. ROM decreased d/t pain, thumb weakness and tenderness. Severe TTP to wound and posterior thumb.

## 2017-04-13 NOTE — ED Provider Notes (Signed)
[facility]   CSN: [id number] Arrival date & time: 04/13/17  1851  By signing my name below, I, Freida Busman, attest that this documentation has been prepared under the direction and in the presence of Roxy Horseman, PA-C. Electronically Signed: Freida Busman, Scribe. 04/13/2017. 8:25 PM.  History   Chief Complaint Chief Complaint  Patient presents with  . Follow-up   The history is provided by the patient. A language interpreter was used ([place]).   HPI Comments:  Jill Riley is a 31 y.o. female who presents to the Emergency Department for re-evaluation of LUE which is currently in a splint. Pt was seen in the ED on 04/11/2017 for a laceration to the left thumb. She had the wound repaired and a thumb spica placed. She was instructed to follow up with specialist for re-examination.  Pt states her job told her not to make any appointments as they're going to send her to a doctor to follow up with. She has been told she cannot return to work unless she can use both hands but her job wants to return despite this. Pt has no acute complaints at this time.   History reviewed. No pertinent past medical history.  There are no active problems to display for this patient.   Past Surgical History:  Procedure Laterality Date  . TUBAL LIGATION      OB History    No data available       Home Medications    Prior to Admission medications   Not on File    Family History No family history on file.  Social History Social History  Substance Use Topics  . Smoking status: Never Smoker  . Smokeless tobacco: Never Used  . Alcohol use No     Allergies   Patient has no known allergies.   Review of Systems Review of Systems  Constitutional: Negative for fever.  Musculoskeletal: Positive for myalgias.  Skin: Positive for wound.   Physical Exam Updated Vital Signs BP (!) 135/101 (BP Location: Left Arm)   Pulse 84   Temp 98.6 F (37 C)  (Oral)   Resp 20   Wt 190 lb (86.2 kg)   LMP  (LMP Unknown)   SpO2 100%   BMI 33.66 kg/m   Physical Exam  Constitutional: She is oriented to person, place, and time. She appears well-developed and well-nourished. No distress.  HENT:  Head: Normocephalic and atraumatic.  Eyes: Conjunctivae are normal.  Cardiovascular: Normal rate.   Pulmonary/Chest: Effort normal.  Abdominal: She exhibits no distension.  Musculoskeletal:  Left thumb laceration; sutures intact, no evidence of infection or abscess  Intact distal pulses  Brisk cap refill  Sensation intact   Neurological: She is alert and oriented to person, place, and time.  Skin: Skin is warm and dry.  Psychiatric: She has a normal mood and affect. Thought content normal.  Nursing note and vitals reviewed.    ED Treatments / Results  DIAGNOSTIC STUDIES:  Oxygen Saturation is 100% on RA, normal by my interpretation.    COORDINATION OF CARE:  8:24 PM Discussed treatment plan with pt at bedside and pt agreed to plan.  Labs (all labs ordered are listed, but only abnormal results are displayed) Labs Reviewed - No data to display  EKG  EKG Interpretation None       Radiology No results found.  Procedures Procedures (including critical care time)  Medications Ordered in ED Medications - No data to display   Initial  Impression / Assessment and Plan / ED Course  I have reviewed the triage vital signs and the nursing notes.  Pertinent labs & imaging results that were available during my care of the patient were reviewed by me and considered in my medical decision making (see chart for details).     Patient here for wound check.  Wound is healing well.  No sign of infection.  Patient mainly concerned about follow-up and work note.  Recommend that she not return to work until seen by hand as originally directed.  Patient understands and agrees with the plan.  She will contact her employer.  Final Clinical  Impressions(s) / ED Diagnoses   Final diagnoses:  Visit for wound check    New Prescriptions There are no discharge medications for this patient.  I personally performed the services described in this documentation, which was scribed in my presence. The recorded information has been reviewed and is accurate.      Roxy Horseman, PA-C 04/13/17 2254    Melene Plan, DO 04/13/17 2350

## 2017-04-16 ENCOUNTER — Other Ambulatory Visit: Payer: Self-pay | Admitting: Orthopedic Surgery

## 2017-04-17 ENCOUNTER — Encounter (HOSPITAL_BASED_OUTPATIENT_CLINIC_OR_DEPARTMENT_OTHER): Payer: Self-pay | Admitting: *Deleted

## 2017-04-20 NOTE — Pre-Procedure Instructions (Signed)
Marlene will be interpreter for pt., per Judy at [facility]; please call [phone] if surgery time changes. 

## 2017-04-21 ENCOUNTER — Ambulatory Visit (HOSPITAL_BASED_OUTPATIENT_CLINIC_OR_DEPARTMENT_OTHER): Payer: Worker's Compensation | Admitting: Anesthesiology

## 2017-04-21 ENCOUNTER — Encounter (HOSPITAL_BASED_OUTPATIENT_CLINIC_OR_DEPARTMENT_OTHER)
Admission: RE | Disposition: A | Discharge status: Home / Self Care | Payer: Self-pay | Source: Ambulatory Visit | Attending: Orthopedic Surgery

## 2017-04-21 ENCOUNTER — Encounter (HOSPITAL_BASED_OUTPATIENT_CLINIC_OR_DEPARTMENT_OTHER): Payer: Self-pay | Admitting: *Deleted

## 2017-04-21 ENCOUNTER — Ambulatory Visit (HOSPITAL_BASED_OUTPATIENT_CLINIC_OR_DEPARTMENT_OTHER)
Admission: RE | Admit: 2017-04-21 | Discharge: 2017-04-21 | Disposition: A | Discharge status: Home / Self Care | Payer: Worker's Compensation | Source: Ambulatory Visit | Attending: Orthopedic Surgery | Admitting: Orthopedic Surgery

## 2017-04-21 DIAGNOSIS — S66222A Laceration of extensor muscle, fascia and tendon of left thumb at wrist and hand level, initial encounter: Secondary | ICD-10-CM | POA: Diagnosis not present

## 2017-04-21 DIAGNOSIS — I10 Essential (primary) hypertension: Secondary | ICD-10-CM | POA: Insufficient documentation

## 2017-04-21 DIAGNOSIS — X58XXXA Exposure to other specified factors, initial encounter: Secondary | ICD-10-CM | POA: Diagnosis not present

## 2017-04-21 DIAGNOSIS — S61012A Laceration without foreign body of left thumb without damage to nail, initial encounter: Secondary | ICD-10-CM | POA: Insufficient documentation

## 2017-04-21 HISTORY — PX: TENDON REPAIR: SHX5111

## 2017-04-21 SURGERY — TENDON REPAIR
Anesthesia: General | Site: Thumb | Laterality: Left

## 2017-04-21 MED ORDER — MEPERIDINE HCL 25 MG/ML IJ SOLN: 6.2500 mg | INTRAMUSCULAR | Status: DC | PRN

## 2017-04-21 MED ORDER — PROMETHAZINE HCL 25 MG/ML IJ SOLN: 6.2500 mg | INTRAMUSCULAR | Status: DC | PRN

## 2017-04-21 MED ORDER — OXYCODONE HCL 5 MG PO TABS: 5.0000 mg | ORAL_TABLET | Freq: Once | ORAL | Status: DC | PRN

## 2017-04-21 MED ORDER — SCOPOLAMINE 1 MG/3DAYS TD PT72: 1.0000 | MEDICATED_PATCH | Freq: Once | TRANSDERMAL | Status: DC | PRN

## 2017-04-21 MED ORDER — CEFAZOLIN SODIUM-DEXTROSE 2-4 GM/100ML-% IV SOLN
2.0000 g | INTRAVENOUS | Status: AC
  Administered 2017-04-21: 2 g via INTRAVENOUS

## 2017-04-21 MED ORDER — BUPIVACAINE HCL (PF) 0.25 % IJ SOLN
INTRAMUSCULAR | Status: DC | PRN
  Administered 2017-04-21: 6 mL

## 2017-04-21 MED ORDER — MIDAZOLAM HCL 2 MG/2ML IJ SOLN
INTRAMUSCULAR | Status: AC
  Filled 2017-04-21: qty 2

## 2017-04-21 MED ORDER — CEFAZOLIN SODIUM-DEXTROSE 2-4 GM/100ML-% IV SOLN
INTRAVENOUS | Status: AC
  Filled 2017-04-21: qty 100

## 2017-04-21 MED ORDER — HYDROMORPHONE HCL 1 MG/ML IJ SOLN: 0.2500 mg | INTRAMUSCULAR | Status: DC | PRN

## 2017-04-21 MED ORDER — FENTANYL CITRATE (PF) 100 MCG/2ML IJ SOLN
INTRAMUSCULAR | Status: AC
  Filled 2017-04-21: qty 2

## 2017-04-21 MED ORDER — SUCCINYLCHOLINE CHLORIDE 200 MG/10ML IV SOSY
PREFILLED_SYRINGE | INTRAVENOUS | Status: AC
  Filled 2017-04-21: qty 10

## 2017-04-21 MED ORDER — PROPOFOL 500 MG/50ML IV EMUL
INTRAVENOUS | Status: AC
  Filled 2017-04-21: qty 50

## 2017-04-21 MED ORDER — EPHEDRINE 5 MG/ML INJ
INTRAVENOUS | Status: AC
  Filled 2017-04-21: qty 10

## 2017-04-21 MED ORDER — FENTANYL CITRATE (PF) 100 MCG/2ML IJ SOLN
50.0000 ug | INTRAMUSCULAR | Status: DC | PRN
  Administered 2017-04-21: 100 ug via INTRAVENOUS

## 2017-04-21 MED ORDER — LIDOCAINE 2% (20 MG/ML) 5 ML SYRINGE
INTRAMUSCULAR | Status: AC
  Filled 2017-04-21: qty 5

## 2017-04-21 MED ORDER — HYDROCODONE-ACETAMINOPHEN 5-325 MG PO TABS: ORAL_TABLET | ORAL | 0 refills | Status: DC

## 2017-04-21 MED ORDER — DEXAMETHASONE SODIUM PHOSPHATE 10 MG/ML IJ SOLN
INTRAMUSCULAR | Status: DC | PRN
  Administered 2017-04-21: 10 mg via INTRAVENOUS

## 2017-04-21 MED ORDER — DEXAMETHASONE SODIUM PHOSPHATE 10 MG/ML IJ SOLN
INTRAMUSCULAR | Status: AC
  Filled 2017-04-21: qty 1

## 2017-04-21 MED ORDER — LIDOCAINE HCL (CARDIAC) 20 MG/ML IV SOLN
INTRAVENOUS | Status: DC | PRN
  Administered 2017-04-21: 75 mg via INTRAVENOUS

## 2017-04-21 MED ORDER — MIDAZOLAM HCL 2 MG/2ML IJ SOLN
1.0000 mg | INTRAMUSCULAR | Status: DC | PRN
  Administered 2017-04-21: 2 mg via INTRAVENOUS

## 2017-04-21 MED ORDER — BUPIVACAINE HCL (PF) 0.25 % IJ SOLN
INTRAMUSCULAR | Status: AC
  Filled 2017-04-21: qty 60

## 2017-04-21 MED ORDER — LACTATED RINGERS IV SOLN
INTRAVENOUS | Status: DC
  Administered 2017-04-21 (×2): via INTRAVENOUS

## 2017-04-21 MED ORDER — ONDANSETRON HCL 4 MG/2ML IJ SOLN
INTRAMUSCULAR | Status: AC
  Filled 2017-04-21: qty 2

## 2017-04-21 MED ORDER — PHENYLEPHRINE 40 MCG/ML (10ML) SYRINGE FOR IV PUSH (FOR BLOOD PRESSURE SUPPORT)
PREFILLED_SYRINGE | INTRAVENOUS | Status: AC
  Filled 2017-04-21: qty 10

## 2017-04-21 MED ORDER — OXYCODONE HCL 5 MG/5ML PO SOLN: 5.0000 mg | Freq: Once | ORAL | Status: DC | PRN

## 2017-04-21 SURGICAL SUPPLY — 81 items
BANDAGE ACE 3X5.8 VEL STRL LF (GAUZE/BANDAGES/DRESSINGS) ×3 IMPLANT
BLADE MINI RND TIP GREEN BEAV (BLADE) IMPLANT
BLADE SURG 15 STRL LF DISP TIS (BLADE) ×2 IMPLANT
BLADE SURG 15 STRL SS (BLADE) ×4
BNDG CONFORM 2 STRL LF (GAUZE/BANDAGES/DRESSINGS) IMPLANT
BNDG ELASTIC 2X5.8 VLCR STR LF (GAUZE/BANDAGES/DRESSINGS) ×3 IMPLANT
BNDG ESMARK 4X9 LF (GAUZE/BANDAGES/DRESSINGS) ×3 IMPLANT
BNDG GAUZE ELAST 4 BULKY (GAUZE/BANDAGES/DRESSINGS) ×3 IMPLANT
BRUSH SCRUB EZ PLAIN DRY (MISCELLANEOUS) ×3 IMPLANT
CATH ROBINSON RED A/P 10FR (CATHETERS) IMPLANT
CHLORAPREP W/TINT 26ML (MISCELLANEOUS) IMPLANT
CORDS BIPOLAR (ELECTRODE) ×3 IMPLANT
COTTONBALL LRG STERILE PKG (GAUZE/BANDAGES/DRESSINGS) IMPLANT
COVER BACK TABLE 60X90IN (DRAPES) ×3 IMPLANT
COVER MAYO STAND STRL (DRAPES) ×3 IMPLANT
CUFF TOURNIQUET SINGLE 18IN (TOURNIQUET CUFF) ×3 IMPLANT
DECANTER SPIKE VIAL GLASS SM (MISCELLANEOUS) ×3 IMPLANT
DRAPE EXTREMITY T 121X128X90 (DRAPE) ×3 IMPLANT
DRAPE OEC MINIVIEW 54X84 (DRAPES) IMPLANT
DRAPE SURG 17X23 STRL (DRAPES) ×3 IMPLANT
DRSG PAD ABDOMINAL 8X10 ST (GAUZE/BANDAGES/DRESSINGS) IMPLANT
GAUZE SPONGE 4X4 12PLY STRL (GAUZE/BANDAGES/DRESSINGS) ×3 IMPLANT
GAUZE SPONGE 4X4 16PLY XRAY LF (GAUZE/BANDAGES/DRESSINGS) IMPLANT
GAUZE XEROFORM 1X8 LF (GAUZE/BANDAGES/DRESSINGS) ×3 IMPLANT
GLOVE BIO SURGEON STRL SZ7.5 (GLOVE) ×3 IMPLANT
GLOVE BIOGEL PI IND STRL 7.0 (GLOVE) ×2 IMPLANT
GLOVE BIOGEL PI IND STRL 8 (GLOVE) ×1 IMPLANT
GLOVE BIOGEL PI INDICATOR 7.0 (GLOVE) ×4
GLOVE BIOGEL PI INDICATOR 8 (GLOVE) ×2
GLOVE ECLIPSE 6.5 STRL STRAW (GLOVE) ×3 IMPLANT
GLOVE SURG ORTHO 8.0 STRL STRW (GLOVE) IMPLANT
GOWN STRL REUS W/ TWL LRG LVL3 (GOWN DISPOSABLE) ×1 IMPLANT
GOWN STRL REUS W/TWL LRG LVL3 (GOWN DISPOSABLE) ×2
GOWN STRL REUS W/TWL XL LVL3 (GOWN DISPOSABLE) ×3 IMPLANT
K-WIRE .035X4 (WIRE) IMPLANT
LOOP VESSEL MAXI BLUE (MISCELLANEOUS) IMPLANT
NEEDLE HYPO 25X1 1.5 SAFETY (NEEDLE) ×3 IMPLANT
NEEDLE KEITH (NEEDLE) IMPLANT
NS IRRIG 1000ML POUR BTL (IV SOLUTION) ×3 IMPLANT
PACK BASIN DAY SURGERY FS (CUSTOM PROCEDURE TRAY) ×3 IMPLANT
PAD CAST 3X4 CTTN HI CHSV (CAST SUPPLIES) ×1 IMPLANT
PAD CAST 4YDX4 CTTN HI CHSV (CAST SUPPLIES) IMPLANT
PADDING CAST ABS 3INX4YD NS (CAST SUPPLIES)
PADDING CAST ABS 4INX4YD NS (CAST SUPPLIES) ×2
PADDING CAST ABS COTTON 3X4 (CAST SUPPLIES) IMPLANT
PADDING CAST ABS COTTON 4X4 ST (CAST SUPPLIES) ×1 IMPLANT
PADDING CAST COTTON 3X4 STRL (CAST SUPPLIES) ×2
PADDING CAST COTTON 4X4 STRL (CAST SUPPLIES)
SLEEVE SCD COMPRESS KNEE MED (MISCELLANEOUS) IMPLANT
SPLINT PLASTER CAST XFAST 3X15 (CAST SUPPLIES) ×20 IMPLANT
SPLINT PLASTER XTRA FASTSET 3X (CAST SUPPLIES) ×40
STOCKINETTE 4X48 STRL (DRAPES) ×3 IMPLANT
SUT CHROMIC 5 0 P 3 (SUTURE) IMPLANT
SUT ETHIBOND 3-0 V-5 (SUTURE) IMPLANT
SUT ETHILON 3 0 PS 1 (SUTURE) IMPLANT
SUT ETHILON 4 0 PS 2 18 (SUTURE) IMPLANT
SUT FIBERWIRE 3-0 18 TAPR NDL (SUTURE)
SUT FIBERWIRE 4-0 18 DIAM BLUE (SUTURE)
SUT MERSILENE 2.0 SH NDLE (SUTURE) IMPLANT
SUT MERSILENE 3 0 FS 1 (SUTURE) IMPLANT
SUT MERSILENE 4 0 P 3 (SUTURE) ×3 IMPLANT
SUT MNCRL AB 4-0 PS2 18 (SUTURE) IMPLANT
SUT MON AB 5-0 PS2 18 (SUTURE) IMPLANT
SUT PROLENE 2 0 SH DA (SUTURE) IMPLANT
SUT PROLENE 6 0 P 1 18 (SUTURE) IMPLANT
SUT SILK 2 0 FS (SUTURE) IMPLANT
SUT SILK 4 0 PS 2 (SUTURE) IMPLANT
SUT SUPRAMID 4-0 (SUTURE) IMPLANT
SUT VIC AB 3-0 PS1 18 (SUTURE)
SUT VIC AB 3-0 PS1 18XBRD (SUTURE) IMPLANT
SUT VIC AB 4-0 P-3 18XBRD (SUTURE) IMPLANT
SUT VIC AB 4-0 P3 18 (SUTURE)
SUT VICRYL 4-0 PS2 18IN ABS (SUTURE) IMPLANT
SUTURE FIBERWR 3-0 18 TAPR NDL (SUTURE) IMPLANT
SUTURE FIBERWR 4-0 18 DIA BLUE (SUTURE) IMPLANT
SYR BULB 3OZ (MISCELLANEOUS) ×3 IMPLANT
SYR CONTROL 10ML LL (SYRINGE) ×3 IMPLANT
TOWEL OR 17X24 6PK STRL BLUE (TOWEL DISPOSABLE) ×3 IMPLANT
TRAY DSU PREP LF (CUSTOM PROCEDURE TRAY) ×3 IMPLANT
TUBE FEEDING ENTERAL 5FR 16IN (TUBING) IMPLANT
UNDERPAD 30X30 (UNDERPADS AND DIAPERS) IMPLANT

## 2017-04-21 NOTE — Discharge Instructions (Addendum)

## 2017-04-21 NOTE — H&P (Signed)
  Jill Riley is an 31 y.o. female.   Chief Complaint: left thumb laceration HPI: 31 yo female with laceration to dorsum left thumb at mp joint level.  [facility]  Followed up in office.  Weakness and pain with resisted extension of thumb.  Allergies: No Known Allergies  Past Medical History:  Diagnosis Date  . Hypertension    no current med.  . Tendon laceration 04/2017   left thumb    Past Surgical History:  Procedure Laterality Date  . TUBAL LIGATION      Family History: History reviewed. No pertinent family history.  Social History:   reports that she has never smoked. She has never used smokeless tobacco. She reports that she does not drink alcohol or use drugs.  Medications: Medications Prior to Admission  Medication Sig Dispense Refill  . cephALEXin (KEFLEX) 500 MG capsule Take 500 mg by mouth 3 (three) times daily.    Marland Kitchen. ibuprofen (ADVIL,MOTRIN) 800 MG tablet Take 800 mg by mouth every 8 (eight) hours as needed.      No results found for this or any previous visit (from the past 48 hour(s)).  No results found.   A comprehensive review of systems was negative.  Blood pressure 113/84, pulse 80, temperature 98.9 F (37.2 C), temperature source Oral, resp. rate 15, height 5\' 3"  (1.6 m), weight 87.4 kg (192 lb 9.6 oz), SpO2 100 %.  General appearance: alert, cooperative and appears stated age Head: Normocephalic, without obvious abnormality, atraumatic Neck: supple, symmetrical, trachea midline Resp: clear to auscultation bilaterally Cardio: regular rate and rhythm GI: non-tender Extremities: Intact sensation and capillary refill all digits.  +epl/fpl/io.  No wounds.  Pulses: 2+ and symmetric Skin: Skin color, texture, turgor normal. No rashes or lesions Neurologic: Grossly normal Incision/Wound: dorsum left thumb at mp level and proximal with sutures  Assessment/Plan Left thumb laceration with pain with resisted  extension.  Plan operative exploration with repair tendon as necessary.  Non operative and operative treatment options were discussed with the patient and patient wishes to proceed with operative treatment. Risks, benefits, and alternatives of surgery were discussed and the patient agrees with the plan of care.  A hospital interpreter was used to obtain informed consent.  Rhonna Holster R 04/21/2017, 1:51 PM

## 2017-04-21 NOTE — Anesthesia Preprocedure Evaluation (Signed)
Anesthesia Evaluation  Patient identified by MRN, date of birth, ID band Patient awake    Reviewed: Allergy & Precautions, NPO status , Patient's Chart, lab work & pertinent test results  Airway Mallampati: II  TM Distance: >3 FB Neck ROM: Full    Dental no notable dental hx.    Pulmonary neg pulmonary ROS,    Pulmonary exam normal breath sounds clear to auscultation       Cardiovascular hypertension, negative cardio ROS Normal cardiovascular exam Rhythm:Regular Rate:Normal     Neuro/Psych negative neurological ROS  negative psych ROS   GI/Hepatic negative GI ROS, Neg liver ROS,   Endo/Other  negative endocrine ROS  Renal/GU negative Renal ROS  negative genitourinary   Musculoskeletal negative musculoskeletal ROS (+)   Abdominal (+) + obese,   Peds negative pediatric ROS (+)  Hematology negative hematology ROS (+)   Anesthesia Other Findings   Reproductive/Obstetrics negative OB ROS                             Anesthesia Physical Anesthesia Plan  ASA: II  Anesthesia Plan: General   Post-op Pain Management:    Induction: Intravenous  Airway Management Planned: LMA  Additional Equipment:   Intra-op Plan:   Post-operative Plan: Extubation in OR  Informed Consent: I have reviewed the patients History and Physical, chart, labs and discussed the procedure including the risks, benefits and alternatives for the proposed anesthesia with the patient or authorized representative who has indicated his/her understanding and acceptance.   Dental advisory given  Plan Discussed with: CRNA  Anesthesia Plan Comments:         Anesthesia Quick Evaluation

## 2017-04-21 NOTE — Op Note (Signed)
[id number] 

## 2017-04-21 NOTE — Anesthesia Postprocedure Evaluation (Signed)
Anesthesia Post Note  Patient: Kessie Desch  Procedure(s) Performed: Procedure(s) (LRB): LEFT THUMB REPAIR EXTENSOR  TENDON (Left)  Patient location during evaluation: PACU Anesthesia Type: General Level of consciousness: awake and alert Pain management: pain level controlled Vital Signs Assessment: post-procedure vital signs reviewed and stable Respiratory status: spontaneous breathing, nonlabored ventilation and respiratory function stable Cardiovascular status: blood pressure returned to baseline and stable Postop Assessment: no signs of nausea or vomiting Anesthetic complications: no       Last Vitals:  Vitals:   04/21/17 1515 04/21/17 1530  BP: 108/81 120/81  Pulse: 84 78  Resp: 12 15  Temp:      Last Pain:  Vitals:   04/21/17 1530  TempSrc:   PainSc: 0-No pain                 Lowella CurbWarren Ray Miller

## 2017-04-21 NOTE — Anesthesia Procedure Notes (Signed)
Procedure Name: LMA Insertion Date/Time: 04/21/2017 2:14 PM Performed by: Zenia ResidesPAYNE, Jane Broughton D Pre-anesthesia Checklist: Patient identified, Emergency Drugs available, Suction available and Patient being monitored Patient Re-evaluated:Patient Re-evaluated prior to inductionOxygen Delivery Method: Circle system utilized Preoxygenation: Pre-oxygenation with 100% oxygen Intubation Type: IV induction Ventilation: Mask ventilation without difficulty LMA: LMA inserted LMA Size: 4.0 Number of attempts: 1 Airway Equipment and Method: Bite block Placement Confirmation: positive ETCO2 Tube secured with: Tape Dental Injury: Teeth and Oropharynx as per pre-operative assessment

## 2017-04-21 NOTE — Brief Op Note (Signed)
04/21/2017  2:44 PM  PATIENT:  Jill Riley  31 y.o. female  PRE-OPERATIVE DIAGNOSIS:  LEFT THUMB TENDON LACERATION  POST-OPERATIVE DIAGNOSIS:  LEFT THUMB TENDON LACERATION  PROCEDURE:  Procedure(s): LEFT THUMB REPAIR EXTENSOR  TENDON (Left)  SURGEON:  Surgeon(s) and Role:    * Betha LoaKuzma, Monserat Prestigiacomo, MD - Primary  PHYSICIAN ASSISTANT:   ASSISTANTS: none   ANESTHESIA:   general  EBL:  No intake/output data recorded.  BLOOD ADMINISTERED:none  DRAINS: none   LOCAL MEDICATIONS USED:  MARCAINE     SPECIMEN:  No Specimen  DISPOSITION OF SPECIMEN:  N/A  COUNTS:  YES  TOURNIQUET:   Total Tourniquet Time Documented: Upper Arm (Left) - 18 minutes Total: Upper Arm (Left) - 18 minutes   DICTATION: .Other Dictation: Dictation Number [id number]  PLAN OF CARE: Discharge to home after PACU  PATIENT DISPOSITION:  PACU - hemodynamically stable.

## 2017-04-21 NOTE — Transfer of Care (Signed)
Immediate Anesthesia Transfer of Care Note  Patient: Jill Riley  Procedure(s) Performed: Procedure(s): LEFT THUMB REPAIR EXTENSOR  TENDON (Left)  Patient Location: PACU  Anesthesia Type:General  Level of Consciousness: awake, alert  and drowsy  Airway & Oxygen Therapy: Patient Spontanous Breathing and Patient connected to face mask oxygen  Post-op Assessment: Report given to RN and Post -op Vital signs reviewed and stable  Post vital signs: Reviewed and stable  Last Vitals:  Vitals:   04/21/17 1217 04/21/17 1455  BP: 113/84   Pulse: 80 99  Resp: 15 13  Temp: 37.2 C     Last Pain:  Vitals:   04/21/17 1217  TempSrc: Oral         Complications: No apparent anesthesia complications

## 2017-04-22 ENCOUNTER — Emergency Department (HOSPITAL_COMMUNITY)
Admission: EM | Admit: 2017-04-22 | Discharge: 2017-04-22 | Disposition: A | Discharge status: Home / Self Care | Payer: Medicaid Other | Attending: Emergency Medicine | Admitting: Emergency Medicine

## 2017-04-22 ENCOUNTER — Encounter (HOSPITAL_BASED_OUTPATIENT_CLINIC_OR_DEPARTMENT_OTHER): Payer: Self-pay | Admitting: Orthopedic Surgery

## 2017-04-22 DIAGNOSIS — I10 Essential (primary) hypertension: Secondary | ICD-10-CM | POA: Insufficient documentation

## 2017-04-22 DIAGNOSIS — R1013 Epigastric pain: Secondary | ICD-10-CM | POA: Diagnosis present

## 2017-04-22 LAB — URINALYSIS, ROUTINE W REFLEX MICROSCOPIC
Bilirubin Urine: NEGATIVE
Glucose, UA: NEGATIVE mg/dL
Ketones, ur: NEGATIVE mg/dL
Nitrite: NEGATIVE
PROTEIN: NEGATIVE mg/dL
Specific Gravity, Urine: 1.006 (ref 1.005–1.030)
pH: 5 (ref 5.0–8.0)

## 2017-04-22 LAB — CBC
HCT: 39.6 % (ref 36.0–46.0)
HEMOGLOBIN: 13.7 g/dL (ref 12.0–15.0)
MCH: 31.3 pg (ref 26.0–34.0)
MCHC: 34.6 g/dL (ref 30.0–36.0)
MCV: 90.4 fL (ref 78.0–100.0)
Platelets: 259 10*3/uL (ref 150–400)
RBC: 4.38 MIL/uL (ref 3.87–5.11)
RDW: 12.2 % (ref 11.5–15.5)
WBC: 14.9 10*3/uL — ABNORMAL HIGH (ref 4.0–10.5)

## 2017-04-22 LAB — COMPREHENSIVE METABOLIC PANEL
ALBUMIN: 4.1 g/dL (ref 3.5–5.0)
ALT: 21 U/L (ref 14–54)
ANION GAP: 9 (ref 5–15)
AST: 24 U/L (ref 15–41)
Alkaline Phosphatase: 56 U/L (ref 38–126)
BUN: 15 mg/dL (ref 6–20)
CHLORIDE: 105 mmol/L (ref 101–111)
CO2: 21 mmol/L — AB (ref 22–32)
CREATININE: 0.62 mg/dL (ref 0.44–1.00)
Calcium: 9.3 mg/dL (ref 8.9–10.3)
GFR calc non Af Amer: >60 mL/min (ref >60)
GLUCOSE: 136 mg/dL — AB (ref 65–99)
Potassium: 3.8 mmol/L (ref 3.5–5.1)
SODIUM: 135 mmol/L (ref 135–145)
Total Bilirubin: 0.4 mg/dL (ref 0.3–1.2)
Total Protein: 7.3 g/dL (ref 6.5–8.1)

## 2017-04-22 LAB — I-STAT BETA HCG BLOOD, ED (MC, WL, AP ONLY)

## 2017-04-22 MED ORDER — SUCRALFATE 1 GM/10ML PO SUSP: 1.0000 g | Freq: Three times a day (TID) | ORAL | 0 refills | Status: DC

## 2017-04-22 MED ORDER — DICYCLOMINE HCL 20 MG PO TABS: 20.0000 mg | ORAL_TABLET | Freq: Two times a day (BID) | ORAL | 0 refills | Status: DC

## 2017-04-22 MED ORDER — OMEPRAZOLE 20 MG PO CPDR: 20.0000 mg | DELAYED_RELEASE_CAPSULE | Freq: Every day | ORAL | 0 refills | Status: DC

## 2017-04-22 NOTE — ED Triage Notes (Signed)
Pt presents with c/o L sided abd pain. The pain began several days ago but has increased severely today. She describes the pain as burning and shooting, with intermittent radiation to her L foot. She denies fevers, chills, nausea, vomiting, urinary changes, vaginal discharge or bleeding, constipation, diarrhea. She treid to rest and hold her stomach with no relief.

## 2017-04-22 NOTE — Op Note (Signed)
NAME:  MADALYN, LEGNER NO.:  MEDICAL RECORD NO.:  [id number]  LOCATION:                                 FACILITY:  PHYSICIAN:  Betha Loa, MD        DATE OF BIRTH:  1986-07-20  DATE OF PROCEDURE:  04/21/2017 DATE OF DISCHARGE:                              OPERATIVE REPORT   PREOPERATIVE DIAGNOSIS:  Left thumb/hand laceration with possible extensor tendon laceration.  POSTOPERATIVE DIAGNOSIS:  Left thumb/hand laceration with EPL laceration  PROCEDURE:  Left thumb/hand exploration of wound with repair of extensor pollicis longus tendon laceration.  SURGEON:  Betha Loa, MD  ASSISTANT:  None.  ANESTHESIA:  General.  IV FLUIDS:  Per anesthesia flow sheet.  ESTIMATED BLOOD LOSS:  Minimal.  COMPLICATIONS:  None.  SPECIMENS:  None.  TOURNIQUET TIME:  18 minutes.  DISPOSITION:  Stable to PACU.  INDICATIONS:  [name] is a 31 year old female who states she lacerated the dorsum of her left thumb while at work approximately a week ago.  She was seen at [facility] where the wound was cleaned and sutured.  She followed up in the office.  She had pain with resisted extension of the thumb.  I recommended operative exploration with repair of tendon, artery and nerve as necessary.  Risks, benefits and alternatives of the surgery were discussed including the risk of blood loss; infection; damage to nerves, vessels, tendons, ligaments, bone; failure of surgery; need for additional surgery; complications with wound healing; continued pain and stiffness.  She voiced understanding of these risks and elected to proceed.  OPERATIVE COURSE:  After being identified preoperatively by myself, the patient and I agreed upon the procedure and site of procedure.  Surgical site was marked.  Risks, benefits and alternative of the surgery were reviewed and she wished to proceed.  Surgical consent had been signed. A hospital interpreter was used.  She  was given IV Ancef as preoperative antibiotic prophylaxis.  She was transferred to the operating room and placed on the operating room table in supine position with left upper extremity on an armboard.  General anesthesia was induced by anesthesiologist.  Left upper extremity was prepped and draped in normal sterile orthopedic fashion.  A surgical pause was performed between the surgeons, anesthesia and operating room staff; and all were in agreement as to the patient, procedure and site of procedure.  Tourniquet at the proximal aspect of the extremity was inflated to 250 mmHg after exsanguination of the limb with an Esmarch bandage.  The wound was opened.  There was visible laceration to the EPL tendon.  The wound was extended both proximally and distally to aid in visualization.  There was no gross contamination or signs of infection.  The laceration was long oblique involving the body of the EPL tendon.  The wound was copiously irrigated.  It did not appear to go into the MP joint.  A 4-0 Mersilene suture was used to reapproximate the tendon edges.  Good reapproximation was obtained.  The skin was closed with 4-0 nylon in a horizontal mattress fashion.  It was injected with 0.25% plain Marcaine to aid in postoperative analgesia.  Wound was then dressed with  sterile Xeroform, 4x4s and wrapped with a Kerlix bandage.  A thumb spica splint was placed and wrapped with Kerlix and Ace bandage.  Tourniquet was deflated at 18 minutes.  Fingertips were pink with brisk capillary refill after deflation of tourniquet.  Operative drapes were broken down, the patient was awakened from anesthesia safely.  She was transferred back to stretcher and taken to PACU in stable condition.  I will see her back in the office in 1 week for postoperative followup.  I will give her Norco 5/325 one to two p.o. q.6 hours p.r.n. pain, dispensed #20.     Betha LoaKevin Willisha Sligar, MD     KK/MEDQ  D:  04/21/2017  T:   04/21/2017  Job:  [id number]

## 2017-04-22 NOTE — ED Provider Notes (Signed)
Emergency Department Provider Note   I have reviewed the triage vital signs and the nursing notes.   HISTORY  Chief Complaint Abdominal Pain   HPI Jill Riley is a 31 y.o. female with PMH of HTN presents to the emergency department for evaluation of epigastric and left-sided abdominal discomfort for the past 2 months. Pain slightly worse with eating. No other exacerbating or alleviating factors. Pain sometimes radiates down to the leg. No vaginal bleeding, discharge, dysuria, hesitancy, urgency. No fevers or chills. No chest pain or difficulty breathing. Pain is mild to moderate and persistent. She reports having daily bowel movements.  Video Spanish interpreter used for HPI, ROS, and PE.   Past Medical History:  Diagnosis Date  . Hypertension    no current med.  . Tendon laceration 04/2017   left thumb    There are no active problems to display for this patient.   Past Surgical History:  Procedure Laterality Date  . TENDON REPAIR Left 04/21/2017   Procedure: LEFT THUMB REPAIR EXTENSOR  TENDON;  Surgeon: Betha LoaKuzma, Kevin, MD;  Location: [facility];  Service: Orthopedics;  Laterality: Left;  . TUBAL LIGATION      Current Outpatient Rx  . Order #: [id number] Class: Historical Med  . Order #: [id number] Class: Print  . Order #: [id number] Class: Print  . Order #: [id number] Class: Historical Med  . Order #: [id number] Class: Print  . Order #: [id number] Class: Print    Allergies Patient has no known allergies.  History reviewed. No pertinent family history.  Social History Social History  Substance Use Topics  . Smoking status: Never Smoker  . Smokeless tobacco: Never Used  . Alcohol use No    Review of Systems  Constitutional: No fever/chills Eyes: No visual changes. ENT: No sore throat. Cardiovascular: Denies chest pain. Respiratory: Denies shortness of breath. Gastrointestinal: Positive epigastric and left sided abdominal pain.  No nausea,  no vomiting.  No diarrhea.  No constipation. Genitourinary: Negative for dysuria. Musculoskeletal: Negative for back pain. Skin: Negative for rash. Neurological: Negative for headaches, focal weakness or numbness.  10-point ROS otherwise negative.  ____________________________________________   PHYSICAL EXAM:  VITAL SIGNS: ED Triage Vitals  Enc Vitals Group     BP 04/22/17 1836 116/79     Pulse Rate 04/22/17 1836 89     Resp 04/22/17 1836 16     Temp 04/22/17 1836 98.4 F (36.9 C)     Temp Source 04/22/17 1836 Oral     SpO2 04/22/17 1836 99 %     Pain Score 04/22/17 1841 8   Constitutional: Alert and oriented. Well appearing and in no acute distress. Eyes: Conjunctivae are normal.  Head: Atraumatic. Nose: No congestion/rhinnorhea. Mouth/Throat: Mucous membranes are moist.  Oropharynx non-erythematous. Neck: No stridor.   Cardiovascular: Normal rate, regular rhythm. Good peripheral circulation. Grossly normal heart sounds.   Respiratory: Normal respiratory effort.  No retractions. Lungs CTAB. Gastrointestinal: Soft and non-tender to palpation throughout the abdomen. No CVA tenderness. No distention.  Musculoskeletal: No lower extremity tenderness nor edema. No gross deformities of extremities. Neurologic:  Normal speech and language. No gross focal neurologic deficits are appreciated.  Skin:  Skin is warm, dry and intact. No rash noted. Psychiatric: Mood and affect are normal. Speech and behavior are normal.  ____________________________________________   LABS (all labs ordered are listed, but only abnormal results are displayed)  Labs Reviewed  COMPREHENSIVE METABOLIC PANEL - Abnormal; Notable for the following:       Result Value  CO2 21 (*)    Glucose, Bld 136 (*)    All other components within normal limits  CBC - Abnormal; Notable for the following:    WBC 14.9 (*)    All other components within normal limits  URINALYSIS, ROUTINE W REFLEX MICROSCOPIC -  Abnormal; Notable for the following:    Color, Urine STRAW (*)    Hgb urine dipstick SMALL (*)    Leukocytes, UA TRACE (*)    Bacteria, UA RARE (*)    Squamous Epithelial / LPF 0-5 (*)    All other components within normal limits  I-STAT BETA HCG BLOOD, ED ([facility], [facility], [facility] ONLY)   ____________________________________________  RADIOLOGY  None ____________________________________________   PROCEDURES  Procedure(s) performed:   Procedures  None ____________________________________________   INITIAL IMPRESSION / ASSESSMENT AND PLAN / ED COURSE  Pertinent labs & imaging results that were available during my care of the patient were reviewed by me and considered in my medical decision making (see chart for details).  Patient presents to the ED for evaluation of epigastric and left sided abdominal pain worse with eating. No vaginal/pelvic symptoms. No dysuria. Symptoms present for the last 2 months and not worsening or changing significantly today. Normal abdominal exam. Labs are largely unremarkable including hCG and UA. Plan for treatment for gastritis/GERD and have her follow up with PCP to continue outpatient w/u. Discussed my impression, plan, and answered all questions using a video spanish interpreter. Patient is pleased at discharge.   At this time, I do not feel there is any life-threatening condition present. I have reviewed and discussed all results (EKG, imaging, lab, urine as appropriate), exam findings with patient. I have reviewed nursing notes and appropriate previous records.  I feel the patient is safe to be discharged home without further emergent workup. Discussed usual and customary return precautions. Patient and family (if present) verbalize understanding and are comfortable with this plan.  Patient will follow-up with their primary care provider. If they do not have a primary care provider, information for follow-up has been provided to them. All questions have been  answered.  ____________________________________________  FINAL CLINICAL IMPRESSION(S) / ED DIAGNOSES  Final diagnoses:  Epigastric pain     MEDICATIONS GIVEN DURING THIS VISIT:  None  NEW OUTPATIENT MEDICATIONS STARTED DURING THIS VISIT:  New Prescriptions   DICYCLOMINE (BENTYL) 20 MG TABLET    Take 1 tablet (20 mg total) by mouth 2 (two) times daily.   OMEPRAZOLE (PRILOSEC) 20 MG CAPSULE    Take 1 capsule (20 mg total) by mouth daily.   SUCRALFATE (CARAFATE) 1 GM/10ML SUSPENSION    Take 10 mLs (1 g total) by mouth 4 (four) times daily -  with meals and at bedtime.      Note:  This document was prepared using Dragon voice recognition software and may include unintentional dictation errors.  Alona Bene, MD Emergency Medicine   Aymara Sassi, Arlyss Repress, MD 04/23/17 1046

## 2017-04-22 NOTE — ED Notes (Signed)
Pt ambulated to lobby with significant other. Pt had no concerns or questions upon discharge.

## 2017-04-22 NOTE — Discharge Instructions (Signed)

## 2017-04-27 DIAGNOSIS — S66222A Laceration of extensor muscle, fascia and tendon of left thumb at wrist and hand level, initial encounter: Secondary | ICD-10-CM | POA: Insufficient documentation

## 2017-04-27 DIAGNOSIS — S61022A Laceration with foreign body of left thumb without damage to nail, initial encounter: Secondary | ICD-10-CM | POA: Insufficient documentation

## 2017-06-27 ENCOUNTER — Emergency Department (HOSPITAL_COMMUNITY)
Admission: EM | Admit: 2017-06-27 | Discharge: 2017-06-28 | Disposition: A | Discharge status: Home / Self Care | Payer: Medicaid Other | Attending: Emergency Medicine | Admitting: Emergency Medicine

## 2017-06-27 ENCOUNTER — Emergency Department (HOSPITAL_COMMUNITY): Payer: Medicaid Other

## 2017-06-27 ENCOUNTER — Encounter (HOSPITAL_COMMUNITY): Payer: Self-pay

## 2017-06-27 DIAGNOSIS — R1084 Generalized abdominal pain: Secondary | ICD-10-CM | POA: Diagnosis present

## 2017-06-27 DIAGNOSIS — Z79899 Other long term (current) drug therapy: Secondary | ICD-10-CM | POA: Insufficient documentation

## 2017-06-27 DIAGNOSIS — N200 Calculus of kidney: Secondary | ICD-10-CM | POA: Diagnosis not present

## 2017-06-27 DIAGNOSIS — B9689 Other specified bacterial agents as the cause of diseases classified elsewhere: Secondary | ICD-10-CM | POA: Insufficient documentation

## 2017-06-27 DIAGNOSIS — N760 Acute vaginitis: Secondary | ICD-10-CM | POA: Insufficient documentation

## 2017-06-27 DIAGNOSIS — I10 Essential (primary) hypertension: Secondary | ICD-10-CM | POA: Insufficient documentation

## 2017-06-27 DIAGNOSIS — R109 Unspecified abdominal pain: Secondary | ICD-10-CM

## 2017-06-27 LAB — URINALYSIS, ROUTINE W REFLEX MICROSCOPIC
Bilirubin Urine: NEGATIVE
Glucose, UA: NEGATIVE mg/dL
KETONES UR: NEGATIVE mg/dL
Nitrite: NEGATIVE
PROTEIN: 100 mg/dL — AB
Specific Gravity, Urine: 1.04 — ABNORMAL HIGH (ref 1.005–1.030)
pH: 5 (ref 5.0–8.0)

## 2017-06-27 LAB — COMPREHENSIVE METABOLIC PANEL
ALBUMIN: 4.2 g/dL (ref 3.5–5.0)
ALK PHOS: 58 U/L (ref 38–126)
ALT: 28 U/L (ref 14–54)
ANION GAP: 8 (ref 5–15)
AST: 25 U/L (ref 15–41)
BUN: 13 mg/dL (ref 6–20)
CHLORIDE: 107 mmol/L (ref 101–111)
CO2: 20 mmol/L — AB (ref 22–32)
Calcium: 9.2 mg/dL (ref 8.9–10.3)
Creatinine, Ser: 1.07 mg/dL — ABNORMAL HIGH (ref 0.44–1.00)
GFR calc Af Amer: >60 mL/min (ref >60)
GFR calc non Af Amer: >60 mL/min (ref >60)
GLUCOSE: 81 mg/dL (ref 65–99)
POTASSIUM: 4.2 mmol/L (ref 3.5–5.1)
SODIUM: 135 mmol/L (ref 135–145)
Total Bilirubin: 0.5 mg/dL (ref 0.3–1.2)
Total Protein: 7.2 g/dL (ref 6.5–8.1)

## 2017-06-27 LAB — CBC
HEMATOCRIT: 41 % (ref 36.0–46.0)
HEMOGLOBIN: 13.7 g/dL (ref 12.0–15.0)
MCH: 30.2 pg (ref 26.0–34.0)
MCHC: 33.4 g/dL (ref 30.0–36.0)
MCV: 90.5 fL (ref 78.0–100.0)
Platelets: 259 10*3/uL (ref 150–400)
RBC: 4.53 MIL/uL (ref 3.87–5.11)
RDW: 12.3 % (ref 11.5–15.5)
WBC: 8.4 10*3/uL (ref 4.0–10.5)

## 2017-06-27 LAB — I-STAT BETA HCG BLOOD, ED (MC, WL, AP ONLY)

## 2017-06-27 LAB — WET PREP, GENITAL
Sperm: NONE SEEN
TRICH WET PREP: NONE SEEN
YEAST WET PREP: NONE SEEN

## 2017-06-27 LAB — RAPID HIV SCREEN (HIV 1/2 AB+AG)
HIV 1/2 Antibodies: NONREACTIVE
HIV-1 P24 ANTIGEN - HIV24: NONREACTIVE

## 2017-06-27 LAB — LIPASE, BLOOD: Lipase: 28 U/L (ref 11–51)

## 2017-06-27 MED ORDER — IOPAMIDOL (ISOVUE-300) INJECTION 61%
INTRAVENOUS | Status: AC
  Administered 2017-06-27: 100 mL
  Filled 2017-06-27: qty 100

## 2017-06-27 MED ORDER — SODIUM CHLORIDE 0.9 % IV BOLUS (SEPSIS)
1000.0000 mL | Freq: Once | INTRAVENOUS | Status: AC
  Administered 2017-06-27: 1000 mL via INTRAVENOUS

## 2017-06-27 MED ORDER — STERILE WATER FOR INJECTION IJ SOLN
INTRAMUSCULAR | Status: AC
  Administered 2017-06-27: 10 mL
  Filled 2017-06-27: qty 10

## 2017-06-27 MED ORDER — CEFTRIAXONE SODIUM 250 MG IJ SOLR
250.0000 mg | Freq: Once | INTRAMUSCULAR | Status: AC
  Administered 2017-06-27: 250 mg via INTRAMUSCULAR
  Filled 2017-06-27: qty 250

## 2017-06-27 MED ORDER — AZITHROMYCIN 1 G PO PACK: 1.0000 g | PACK | Freq: Once | ORAL | Status: DC

## 2017-06-27 MED ORDER — METRONIDAZOLE 500 MG PO TABS: 500.0000 mg | ORAL_TABLET | Freq: Two times a day (BID) | ORAL | 0 refills | Status: DC

## 2017-06-27 MED ORDER — AZITHROMYCIN 250 MG PO TABS
1000.0000 mg | ORAL_TABLET | Freq: Once | ORAL | Status: AC
  Administered 2017-06-27: 1000 mg via ORAL
  Filled 2017-06-27: qty 4

## 2017-06-27 NOTE — ED Triage Notes (Signed)
Pt presents to the ed with complaints of left sided abdominal pain that started yesterday, it has gotten worse today and she feels like it is radiating down into her leg. Pt also complains of nausea.

## 2017-06-27 NOTE — ED Notes (Signed)
Nurse currently starting IV and will draw labs. 

## 2017-06-27 NOTE — ED Provider Notes (Signed)
[facility]-EMERGENCY DEPT Provider Note   CSN: [id number] Arrival date & time: 06/27/17  1816  By signing my name below, I, Ny'Kea Lewis, attest that this documentation has been prepared under the direction and in the presence of , Lyndel Safe, [place]. Electronically Signed: Karren Cobble, ED Scribe. 06/27/17. 9:09 PM.  History   Chief Complaint Chief Complaint  Patient presents with  . Abdominal Pain   The history is provided by the patient. A language interpreter was used.    HPI Comments: Jill Riley is a 31 y.o. female with a history of hypertension, who presents to the Emergency Department complaining of sudden onset, intermittent left lower quadrant abdominal pain that began this morning. Pt notes associated  pain that radiates into her left leg, one episode of dizziness, and nausea. She reports she has missed her menstrual cycle for five months and last month (05/2017) she had a menstrual cycle . At this time, she reports her abdominal pain has resolved. Pt reports she has taken a pregnancy test in the past, when she was missing periods, but reports it was negative. Denies hx of similar pain or nephrolithasis. PSHx of tubal ligation but denies abdominal surgeries. Denies constipation, diarrhea, foul smelling urine, dysuria, urinary frequency, vaginal bleeding or vaginal discharge.    Past Medical History:  Diagnosis Date  . Hypertension    no current med.  . Tendon laceration 04/2017   left thumb   There are no active problems to display for this patient.  Past Surgical History:  Procedure Laterality Date  . TENDON REPAIR Left 04/21/2017   Procedure: LEFT THUMB REPAIR EXTENSOR  TENDON;  Surgeon: Betha Loa, MD;  Location: [facility];  Service: Orthopedics;  Laterality: Left;  . TUBAL LIGATION     OB History    No data available     Home Medications    Prior to Admission medications   Medication Sig Start Date End Date Taking? Authorizing Provider    cephALEXin (KEFLEX) 500 MG capsule Take 500 mg by mouth 3 (three) times daily. 04/14/17   [provider]  dicyclomine (BENTYL) 20 MG tablet Take 1 tablet (20 mg total) by mouth 2 (two) times daily. 04/22/17   Long, Arlyss Repress, MD  HYDROcodone-acetaminophen [facility]) 5-325 MG tablet 1-2 tabs po q6 hours prn pain 04/21/17   Betha Loa, MD  ibuprofen (ADVIL,MOTRIN) 800 MG tablet Take 800 mg by mouth every 8 (eight) hours as needed.    [provider]  metroNIDAZOLE (FLAGYL) 500 MG tablet Take 1 tablet (500 mg total) by mouth 2 (two) times daily. 06/27/17   Cristina Gong, PA-C  omeprazole (PRILOSEC) 20 MG capsule Take 1 capsule (20 mg total) by mouth daily. 04/22/17 05/22/17  Long, Arlyss Repress, MD  sucralfate (CARAFATE) 1 GM/10ML suspension Take 10 mLs (1 g total) by mouth 4 (four) times daily -  with meals and at bedtime. 04/22/17   Long, Arlyss Repress, MD   Family History No family history on file.  Social History Social History  Substance Use Topics  . Smoking status: Never Smoker  . Smokeless tobacco: Never Used  . Alcohol use No   Allergies   Patient has no known allergies.  Review of Systems Review of Systems  Constitutional: Negative for chills and fever.  HENT: Negative for congestion and sore throat.   Eyes: Negative for visual disturbance.  Respiratory: Negative for shortness of breath.   Cardiovascular: Negative for leg swelling.  Gastrointestinal: Positive for abdominal pain, nausea  and vomiting. Negative for abdominal distention, constipation and diarrhea.  Genitourinary: Negative for difficulty urinating, flank pain and frequency.  Musculoskeletal: Negative for back pain and neck pain.  Skin: Negative for color change and rash.  Neurological: Negative for dizziness, syncope, light-headedness and headaches.   Physical Exam Updated Vital Signs BP 126/81 (BP Location: Right Arm)   Pulse 68   Temp 98.4 F (36.9 C) (Oral)   Resp 16   Ht 5\' 5"  (1.651 m)   Wt 87.1 kg  (192 lb)   LMP 06/03/2017   SpO2 100%   BMI 31.95 kg/m    Physical Exam  Constitutional: Vital signs are normal. She appears well-developed and well-nourished. No distress.  HENT:  Head: Normocephalic and atraumatic.  Eyes: Conjunctivae are normal. Right eye exhibits no discharge. Left eye exhibits no discharge. No scleral icterus.  Neck: Normal range of motion.  Cardiovascular: Normal rate and regular rhythm.   Pulmonary/Chest: Effort normal and breath sounds normal. No stridor. No respiratory distress. She has no decreased breath sounds.  Abdominal: Soft. Bowel sounds are normal. She exhibits no distension. There is no hepatosplenomegaly. There is no rigidity, no guarding and no CVA tenderness.  Abd mildly TTP over left middle at level of umbilicus  Genitourinary: Pelvic exam was performed with patient supine. There is no rash on the right labia. There is no rash on the left labia. Cervix exhibits no motion tenderness. Right adnexum displays no mass, no tenderness and no fullness. Left adnexum displays no mass, no tenderness and no fullness. No erythema or bleeding in the vagina. Vaginal discharge found.  Genitourinary Comments: Chaperone is present.  Musculoskeletal: She exhibits no edema or deformity.  Neurological: She is alert. She exhibits normal muscle tone.  Skin: Skin is warm and dry. She is not diaphoretic.  Psychiatric: She has a normal mood and affect. Her behavior is normal.  Nursing note and vitals reviewed.  ED Treatments / Results  DIAGNOSTIC STUDIES: Oxygen Saturation is 99% on RA, normal by my interpretation.   COORDINATION OF CARE: 9:05 PM-Discussed next steps with pt. Pt verbalized understanding and is agreeable with the plan.   Labs (all labs ordered are listed, but only abnormal results are displayed) Labs Reviewed  WET PREP, GENITAL - Abnormal; Notable for the following:       Result Value   Clue Cells Wet Prep HPF POC PRESENT (*)    WBC, Wet Prep HPF  POC MANY (*)    All other components within normal limits  COMPREHENSIVE METABOLIC PANEL - Abnormal; Notable for the following:    CO2 20 (*)    Creatinine, Ser 1.07 (*)    All other components within normal limits  URINALYSIS, ROUTINE W REFLEX MICROSCOPIC - Abnormal; Notable for the following:    APPearance CLOUDY (*)    Specific Gravity, Urine 1.040 (*)    Hgb urine dipstick LARGE (*)    Protein, ur 100 (*)    Leukocytes, UA LARGE (*)    Bacteria, UA FEW (*)    Squamous Epithelial / LPF 6-30 (*)    All other components within normal limits  URINE CULTURE  LIPASE, BLOOD  CBC  RAPID HIV SCREEN (HIV 1/2 AB+AG)  RPR  I-STAT BETA HCG BLOOD, ED ([facility], [facility], [facility] ONLY)  GC/CHLAMYDIA PROBE AMP ([place]) NOT AT [facility]   EKG  EKG Interpretation None      Radiology Ct Abdomen Pelvis W Contrast  Result Date: 06/27/2017 CLINICAL DATA:  [age] year old female with history  of abdominal pain, nausea, vomiting and blood in urine. EXAM: CT ABDOMEN AND PELVIS WITH CONTRAST TECHNIQUE: Multidetector CT imaging of the abdomen and pelvis was performed using the standard protocol following bolus administration of intravenous contrast. CONTRAST:  ISOVUE-300 IOPAMIDOL (ISOVUE-300) INJECTION 61% COMPARISON:  CT the abdomen and pelvis 10/09/2014. FINDINGS: Lower chest: Unremarkable. Hepatobiliary: No cystic or solid hepatic lesions. No intra or extrahepatic biliary ductal dilatation. Gallbladder is normal in appearance. Pancreas: No pancreatic mass. No pancreatic ductal dilatation. No pancreatic or peripancreatic fluid or inflammatory changes. Spleen: Unremarkable. Adrenals/Urinary Tract: 13 x 12 x 7 mm nonobstructive calculus in the interpolar collecting system of the left kidney. Additional 5 mm nonobstructive calculus in the lower pole collecting system of the right kidney. Kidneys are otherwise normal in appearance. No definite ureteral stones or bladder stones. Urinary bladder is normal in appearance.  Bilateral adrenal glands are normal in appearance. Stomach/Bowel: Normal appearance of the stomach. No pathologic dilatation of small bowel or colon. Normal appendix. Vascular/Lymphatic: No significant atherosclerotic disease, aneurysm or dissection identified in the abdominal or pelvic vasculature. No lymphadenopathy noted in the abdomen or pelvis. Reproductive: Uterus and ovaries are unremarkable in appearance. Other: No significant volume of ascites.  No pneumoperitoneum. Musculoskeletal: There are no aggressive appearing lytic or blastic lesions noted in the visualized portions of the skeleton. IMPRESSION: 1. Nonobstructive calculi within the collecting systems of both kidneys measuring up to 13 x 12 x 7 mm in the interpolar collecting system of the left kidney. No ureteral stones or findings of urinary tract obstruction are noted at this time. 2. No other acute findings are noted elsewhere in the abdomen or pelvis. Specifically, the appendix is normal. Electronically Signed   By: Trudie Reed M.D.   On: 06/27/2017 22:25    Procedures Procedures (including critical care time)  Medications Ordered in ED Medications  sodium chloride 0.9 % bolus 1,000 mL (0 mLs Intravenous Stopped 06/27/17 2328)  iopamidol (ISOVUE-300) 61 % injection (100 mLs  Contrast Given 06/27/17 2155)  cefTRIAXone (ROCEPHIN) injection 250 mg (250 mg Intramuscular Given 06/27/17 2339)  azithromycin (ZITHROMAX) tablet 1,000 mg (1,000 mg Oral Given 06/27/17 2337)  sterile water (preservative free) injection (10 mLs  Given 06/27/17 2339)  ondansetron (ZOFRAN-ODT) disintegrating tablet 4 mg (4 mg Oral Given 06/28/17 0023)   Was advised by RN that while patient was receiving discharge instructions she vomited. Patient states that she only vomited up the crackers, did not vomit up the antibiotic pills that were given to her. Patient refused re-dosage of antibiotics.  Initial Impression / Assessment and Plan / ED Course  I have  reviewed the triage vital signs and the nursing notes.  Pertinent labs & imaging results that were available during my care of the patient were reviewed by me and considered in my medical decision making (see chart for details).     Aadhya Sanabia presents with one episode of severe abdominal pain associated with nausea and vomiting that has since resolved.  Based on her history, and urine along with CT scan showing bilateral renal stones I suspect that she had a stone passed earlier which caused her pain, and went away once she passed it.  Patient was given follow-up with urology due to the significant size of the stones that remained in her kidneys, and instructed that if her symptoms return she should go to [facility] as that is where the urologists are based.  Patient is nontoxic, nonseptic appearing, in no apparent distress.  Patient's pain and other symptoms adequately managed in emergency department.  Fluid bolus given.  Labs, imaging and vitals reviewed.  Patient does not meet the SIRS or Sepsis criteria.  On repeat exam patient does not have a surgical abdomen and there are no peritoneal signs.  No indication of appendicitis, bowel obstruction, bowel perforation, cholecystitis, diverticulitis, PID or ectopic pregnancy.    Pt understands that they have GC/Chlamydia cultures pending and that they will need to inform all sexual partners if results return positive. Pt has been treated prophylacticly with azithromycin and rocephin due to pts history, pelvic exam, and wet prep with increased WBCs. Pt not concerning for PID because hemodynamically stable and no cervical motion tenderness on pelvic exam. Pt has also been treated with flagyl for Bacterial Vaginosis. Pt has been advised to not drink alcohol while on this medication.       Final Clinical Impressions(s) / ED Diagnoses   Final diagnoses:  BV (bacterial vaginosis)  Renal stone  Abdominal pain, unspecified abdominal  location   New Prescriptions Discharge Medication List as of 06/27/2017 11:09 PM    START taking these medications   Details  metroNIDAZOLE (FLAGYL) 500 MG tablet Take 1 tablet (500 mg total) by mouth 2 (two) times daily., Starting Sat 06/27/2017, Print      I personally performed the services described in this documentation, which was scribed in my presence. The recorded information has been reviewed and is accurate.    Cristina Gong, PA-C 06/28/17 0104    Nira Conn, MD 06/29/17 0003

## 2017-06-27 NOTE — ED Notes (Signed)
Translator used for triage. 

## 2017-06-28 MED ORDER — ONDANSETRON 4 MG PO TBDP
4.0000 mg | ORAL_TABLET | Freq: Once | ORAL | Status: AC
  Administered 2017-06-28: 4 mg via ORAL
  Filled 2017-06-28: qty 1

## 2017-06-28 NOTE — ED Notes (Signed)
Pt went to [facility] to vomit during discharge instructions.  Discharge held.  EDP made aware.

## 2017-06-29 LAB — GC/CHLAMYDIA PROBE AMP (~~LOC~~) NOT AT ARMC
Chlamydia: NEGATIVE
Neisseria Gonorrhea: NEGATIVE

## 2017-06-29 LAB — URINE CULTURE

## 2017-06-30 LAB — RPR: RPR Ser Ql: NONREACTIVE

## 2017-07-01 ENCOUNTER — Other Ambulatory Visit: Payer: Self-pay | Admitting: Urology

## 2017-07-08 ENCOUNTER — Encounter (HOSPITAL_BASED_OUTPATIENT_CLINIC_OR_DEPARTMENT_OTHER): Payer: Self-pay | Admitting: *Deleted

## 2017-07-08 NOTE — Progress Notes (Signed)
SPOKE W/ PT THRU PACIFIC INTERPRETER [phone]#[id number].  NPO AFTER MN W/ EXCEPTION CLEAR LIQUIDS UNTIL 0730.  ARRIVE AT 1215.  NEEDS ISTAT 8.  REQUESTED SPANISH INTERPRETER TO ARRIVE AT 1200 AND WHEN CONFIRMATION WILL PLACE CONFIRMATION WHEN RECEIVED VIA EMAIL.

## 2017-07-15 ENCOUNTER — Encounter (HOSPITAL_BASED_OUTPATIENT_CLINIC_OR_DEPARTMENT_OTHER): Payer: Self-pay | Admitting: *Deleted

## 2017-07-15 ENCOUNTER — Ambulatory Visit (HOSPITAL_BASED_OUTPATIENT_CLINIC_OR_DEPARTMENT_OTHER): Payer: Medicaid Other | Admitting: Anesthesiology

## 2017-07-15 ENCOUNTER — Encounter (HOSPITAL_BASED_OUTPATIENT_CLINIC_OR_DEPARTMENT_OTHER)
Admission: RE | Disposition: A | Discharge status: Home / Self Care | Payer: Self-pay | Source: Ambulatory Visit | Attending: Urology

## 2017-07-15 ENCOUNTER — Ambulatory Visit (HOSPITAL_BASED_OUTPATIENT_CLINIC_OR_DEPARTMENT_OTHER)
Admission: RE | Admit: 2017-07-15 | Discharge: 2017-07-15 | Disposition: A | Discharge status: Home / Self Care | Payer: Medicaid Other | Source: Ambulatory Visit | Attending: Urology | Admitting: Urology

## 2017-07-15 DIAGNOSIS — Z87442 Personal history of urinary calculi: Secondary | ICD-10-CM | POA: Diagnosis not present

## 2017-07-15 DIAGNOSIS — N200 Calculus of kidney: Secondary | ICD-10-CM | POA: Diagnosis present

## 2017-07-15 HISTORY — DX: Presence of spectacles and contact lenses: Z97.3

## 2017-07-15 HISTORY — PX: CYSTOSCOPY WITH RETROGRADE PYELOGRAM, URETEROSCOPY AND STENT PLACEMENT: SHX5789

## 2017-07-15 HISTORY — PX: HOLMIUM LASER APPLICATION: SHX5852

## 2017-07-15 LAB — POCT I-STAT, CHEM 8
BUN: 13 mg/dL (ref 6–20)
CHLORIDE: 101 mmol/L (ref 101–111)
Calcium, Ion: 1.28 mmol/L (ref 1.15–1.40)
Creatinine, Ser: 0.6 mg/dL (ref 0.44–1.00)
Glucose, Bld: 88 mg/dL (ref 65–99)
HEMATOCRIT: 46 % (ref 36.0–46.0)
Hemoglobin: 15.6 g/dL — ABNORMAL HIGH (ref 12.0–15.0)
POTASSIUM: 4.1 mmol/L (ref 3.5–5.1)
SODIUM: 138 mmol/L (ref 135–145)
TCO2: 26 mmol/L (ref 0–100)

## 2017-07-15 SURGERY — CYSTOURETEROSCOPY, WITH RETROGRADE PYELOGRAM AND STENT INSERTION
Anesthesia: General | Laterality: Bilateral

## 2017-07-15 MED ORDER — PROPOFOL 10 MG/ML IV BOLUS
INTRAVENOUS | Status: AC
  Filled 2017-07-15: qty 40

## 2017-07-15 MED ORDER — GENTAMICIN SULFATE 40 MG/ML IJ SOLN
5.0000 mg/kg | INTRAVENOUS | Status: DC
  Filled 2017-07-15: qty 11

## 2017-07-15 MED ORDER — ONDANSETRON HCL 4 MG/2ML IJ SOLN
INTRAMUSCULAR | Status: DC | PRN
  Administered 2017-07-15: 4 mg via INTRAVENOUS

## 2017-07-15 MED ORDER — FENTANYL CITRATE (PF) 100 MCG/2ML IJ SOLN
INTRAMUSCULAR | Status: DC | PRN
  Administered 2017-07-15 (×8): 25 ug via INTRAVENOUS

## 2017-07-15 MED ORDER — KETOROLAC TROMETHAMINE 10 MG PO TABS: 10.0000 mg | ORAL_TABLET | Freq: Four times a day (QID) | ORAL | 1 refills | Status: DC | PRN

## 2017-07-15 MED ORDER — PROMETHAZINE HCL 25 MG/ML IJ SOLN
6.2500 mg | INTRAMUSCULAR | Status: DC | PRN
  Filled 2017-07-15: qty 1

## 2017-07-15 MED ORDER — KETOROLAC TROMETHAMINE 30 MG/ML IJ SOLN
INTRAMUSCULAR | Status: AC
  Filled 2017-07-15: qty 1

## 2017-07-15 MED ORDER — FENTANYL CITRATE (PF) 100 MCG/2ML IJ SOLN
25.0000 ug | INTRAMUSCULAR | Status: DC | PRN
  Administered 2017-07-15 (×2): 25 ug via INTRAVENOUS
  Filled 2017-07-15: qty 1

## 2017-07-15 MED ORDER — DEXAMETHASONE SODIUM PHOSPHATE 4 MG/ML IJ SOLN
INTRAMUSCULAR | Status: DC | PRN
  Administered 2017-07-15: 10 mg via INTRAVENOUS

## 2017-07-15 MED ORDER — CEPHALEXIN 500 MG PO CAPS: 500.0000 mg | ORAL_CAPSULE | Freq: Two times a day (BID) | ORAL | 0 refills | Status: DC

## 2017-07-15 MED ORDER — MIDAZOLAM HCL 2 MG/2ML IJ SOLN
INTRAMUSCULAR | Status: AC
  Filled 2017-07-15: qty 2

## 2017-07-15 MED ORDER — ONDANSETRON HCL 4 MG/2ML IJ SOLN
INTRAMUSCULAR | Status: AC
  Filled 2017-07-15: qty 2

## 2017-07-15 MED ORDER — SENNOSIDES-DOCUSATE SODIUM 8.6-50 MG PO TABS: 1.0000 | ORAL_TABLET | Freq: Two times a day (BID) | ORAL | 0 refills | Status: DC

## 2017-07-15 MED ORDER — ONDANSETRON HCL 4 MG/2ML IJ SOLN
4.0000 mg | Freq: Once | INTRAMUSCULAR | Status: AC
  Administered 2017-07-15: 4 mg via INTRAVENOUS
  Filled 2017-07-15: qty 2

## 2017-07-15 MED ORDER — PROPOFOL 10 MG/ML IV BOLUS
INTRAVENOUS | Status: DC | PRN
  Administered 2017-07-15: 50 mg via INTRAVENOUS
  Administered 2017-07-15: 200 mg via INTRAVENOUS
  Administered 2017-07-15: 150 mg via INTRAVENOUS
  Administered 2017-07-15: 50 mg via INTRAVENOUS

## 2017-07-15 MED ORDER — LACTATED RINGERS IV SOLN
INTRAVENOUS | Status: DC
  Administered 2017-07-15 (×3): via INTRAVENOUS
  Filled 2017-07-15: qty 1000

## 2017-07-15 MED ORDER — GENTAMICIN SULFATE 40 MG/ML IJ SOLN
5.0000 mg/kg | Freq: Once | INTRAVENOUS | Status: AC
  Administered 2017-07-15: 340 mg via INTRAVENOUS
  Filled 2017-07-15: qty 8.5

## 2017-07-15 MED ORDER — KETOROLAC TROMETHAMINE 30 MG/ML IJ SOLN
INTRAMUSCULAR | Status: DC | PRN
  Administered 2017-07-15: 30 mg via INTRAVENOUS

## 2017-07-15 MED ORDER — LIDOCAINE HCL (CARDIAC) 20 MG/ML IV SOLN
INTRAVENOUS | Status: DC | PRN
  Administered 2017-07-15: 100 mg via INTRAVENOUS

## 2017-07-15 MED ORDER — OXYCODONE-ACETAMINOPHEN 5-325 MG PO TABS
1.0000 | ORAL_TABLET | Freq: Four times a day (QID) | ORAL | Status: DC | PRN
  Administered 2017-07-15: 1 via ORAL
  Filled 2017-07-15: qty 2

## 2017-07-15 MED ORDER — LIDOCAINE 2% (20 MG/ML) 5 ML SYRINGE
INTRAMUSCULAR | Status: AC
  Filled 2017-07-15: qty 5

## 2017-07-15 MED ORDER — OXYCODONE-ACETAMINOPHEN 5-325 MG PO TABS
ORAL_TABLET | ORAL | Status: AC
  Filled 2017-07-15: qty 1

## 2017-07-15 MED ORDER — DEXAMETHASONE SODIUM PHOSPHATE 10 MG/ML IJ SOLN
INTRAMUSCULAR | Status: AC
  Filled 2017-07-15: qty 1

## 2017-07-15 MED ORDER — PROPOFOL 10 MG/ML IV BOLUS
INTRAVENOUS | Status: AC
  Filled 2017-07-15: qty 20

## 2017-07-15 MED ORDER — FENTANYL CITRATE (PF) 100 MCG/2ML IJ SOLN
INTRAMUSCULAR | Status: AC
  Filled 2017-07-15: qty 2

## 2017-07-15 MED ORDER — OXYCODONE-ACETAMINOPHEN 5-325 MG PO TABS: 1.0000 | ORAL_TABLET | Freq: Four times a day (QID) | ORAL | 0 refills | Status: DC | PRN

## 2017-07-15 MED ORDER — MIDAZOLAM HCL 5 MG/5ML IJ SOLN
INTRAMUSCULAR | Status: DC | PRN
  Administered 2017-07-15: 2 mg via INTRAVENOUS

## 2017-07-15 MED ORDER — KETOROLAC TROMETHAMINE 30 MG/ML IJ SOLN
30.0000 mg | Freq: Once | INTRAMUSCULAR | Status: DC | PRN
  Filled 2017-07-15: qty 1

## 2017-07-15 SURGICAL SUPPLY — 23 items
BAG DRAIN URO-CYSTO SKYTR STRL (DRAIN) ×3 IMPLANT
BASKET LASER NITINOL 1.9FR (BASKET) ×3 IMPLANT
CATH INTERMIT  6FR 70CM (CATHETERS) ×3 IMPLANT
CLOTH BEACON ORANGE TIMEOUT ST (SAFETY) ×3 IMPLANT
FIBER LASER FLEXIVA 200 (UROLOGICAL SUPPLIES) ×3 IMPLANT
GLOVE BIO SURGEON STRL SZ7.5 (GLOVE) ×3 IMPLANT
GOWN STRL REUS W/TWL XL LVL3 (GOWN DISPOSABLE) ×6 IMPLANT
GUIDEWIRE ANG ZIPWIRE 038X150 (WIRE) ×3 IMPLANT
GUIDEWIRE STR DUAL SENSOR (WIRE) ×6 IMPLANT
INFUSOR MANOMETER BAG 3000ML (MISCELLANEOUS) ×3 IMPLANT
IV NS 1000ML (IV SOLUTION) ×2
IV NS 1000ML BAXH (IV SOLUTION) ×1 IMPLANT
IV NS IRRIG 3000ML ARTHROMATIC (IV SOLUTION) ×9 IMPLANT
KIT RM TURNOVER CYSTO AR (KITS) ×3 IMPLANT
MANIFOLD NEPTUNE II (INSTRUMENTS) ×3 IMPLANT
NS IRRIG 500ML POUR BTL (IV SOLUTION) ×3 IMPLANT
PACK CYSTO (CUSTOM PROCEDURE TRAY) ×3 IMPLANT
SHEATH ACCESS URETERAL 24CM (SHEATH) ×3 IMPLANT
STENT URET 6FRX24 CONTOUR (STENTS) ×6 IMPLANT
SYRINGE 10CC LL (SYRINGE) ×3 IMPLANT
TUBE CONNECTING 12'X1/4 (SUCTIONS) ×1
TUBE CONNECTING 12X1/4 (SUCTIONS) ×2 IMPLANT
TUBE FEEDING 8FR 16IN STR KANG (MISCELLANEOUS) ×3 IMPLANT

## 2017-07-15 NOTE — Interval H&P Note (Signed)
History and Physical Interval Note:  07/15/2017 2:05 PM  Jill Riley  has presented today for surgery, with the diagnosis of BILATERAL KIDNEY STONES  The various methods of treatment have been discussed with the patient and family. After consideration of risks, benefits and other options for treatment, the patient has consented to  Procedure(s): CYSTOSCOPY WITH RETROGRADE PYELOGRAM, URETEROSCOPY AND STENT PLACEMENT (Bilateral) HOLMIUM LASER APPLICATION (Bilateral) as a surgical intervention .  The patient's history has been reviewed, patient examined, no change in status, stable for surgery.  I have reviewed the patient's chart and labs.  Questions were answered to the patient's satisfaction.     Jill Riley

## 2017-07-15 NOTE — Transfer of Care (Signed)
Immediate Anesthesia Transfer of Care Note  Patient: Jill Riley  Procedure(s) Performed: Procedure(s) (LRB): CYSTOSCOPY WITH bilateral RETROGRADE PYELOGRAM, right URETEROSCOPY, left diagnostic ureteroscopy AND STENT PLACEMENT (Bilateral) HOLMIUM LASER APPLICATION (Bilateral)  Patient Location: PACU  Anesthesia Type: General  Level of Consciousness: awake, sedated, patient cooperative and responds to stimulation  Airway & Oxygen Therapy: Patient Spontanous Breathing and Patient connected to [place] oxygen  Post-op Assessment: Report given to PACU RN, Post -op Vital signs reviewed and stable and Patient moving all extremities  Post vital signs: Reviewed and stable  Complications: No apparent anesthesia complications

## 2017-07-15 NOTE — H&P (Signed)
Jill Riley is an 31 y.o. female.    Chief Complaint: Pre-op BILATERAL ureteroscopic stone manipulation  HPI:    1 - Nephrolithiasis - Rt 4mm papillary tip (non-obstructing) and Lt lower mid 13mm (non-obstructing) stones by ER CT 06/2017 on eval LLQ pain. Stones 1200 HU, SSD 10cm. NO hydro whatsoever.   PMH sig for BTL, hand surgery. NO CV disease / blood thinners. 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   Today "Jill Riley" is seen to proceed with bilateral ureteroscopic stone manipulation with goal of stone free. No interval fevers. 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   Past Medical History:  Diagnosis Date  . Renal calculi    BILATERAL  . Wears glasses     Past Surgical History:  Procedure Laterality Date  . TENDON REPAIR Left 04/21/2017   Procedure: LEFT THUMB REPAIR EXTENSOR  TENDON;  Surgeon: Betha LoaKuzma, Kevin, MD;  Location: [facility];  Service: Orthopedics;  Laterality: Left;  . [place]UBAL LIG[place] 2013   [place]   "had procedure to not have children any more"    History reviewed. No pertinent family history. Social History:  reports that she has never smoked. She has never used smokeless tobacco. She reports that she does not drink alcohol or use drugs.  Allergies: No Known Allergies  No prescriptions prior to admission.    No results found for this or any previous visit (from the past 48 hour(s)). No results found.  Review of Systems  Constitutional: Negative.  Negative for chills and fever.  HENT: Negative.   Eyes: Negative.   Respiratory: Negative.   Cardiovascular: Negative.   Gastrointestinal: Positive for abdominal pain.  Genitourinary: Negative.   Musculoskeletal: Negative.   Skin: Negative.   Neurological: Negative.   Endo/Heme/Allergies: Negative.   Psychiatric/Behavioral: Negative.     Height 5\' 4"  (1.626 m), weight 86.2 kg (190 lb),  last menstrual period 06/11/2017. Physical Exam  Constitutional: She appears well-developed.  HENT:  Head: Normocephalic.  Eyes: Pupils are equal, round, and reactive to light.  Neck: Normal range of motion.  Cardiovascular: Normal rate.   Respiratory: Effort normal.  GI: Soft.  Genitourinary:  Genitourinary Comments: No CVAT at present.   Musculoskeletal: Normal range of motion.  Neurological: She is alert.  Skin: Skin is warm.  Psychiatric: She has a normal mood and affect. Her behavior is normal. Judgment and thought content normal.     Assessment/Plan  Proceed as planned with BILATERAL ureteroscopic stone manipulation with goal of stone free. Risks, benefits, alternatives, expected peri-op course, need for temporary stents (since bilateral), need for possible staged approach discussed previously and reiterated today.     Sebastian AcheMANNY, Nocholas Damaso, MD 07/15/2017, 6:19 AM

## 2017-07-15 NOTE — Discharge Instructions (Signed)
1 - You may have urinary urgency (bladder spasms) and bloody urine on / off with stent in place. This is normal. ° °2 - Call MD or go to ER for fever >102, severe pain / nausea / vomiting not relieved by medications, or acute change in medical status ° °

## 2017-07-15 NOTE — Brief Op Note (Signed)
07/15/2017  2:53 PM  PATIENT:  Jill Riley  31 y.o. female  PRE-OPERATIVE DIAGNOSIS:  BILATERAL KIDNEY STONES  POST-OPERATIVE DIAGNOSIS:  BILATERAL KIDNEY STONES  PROCEDURE:  Procedure(s): CYSTOSCOPY WITH bilateral RETROGRADE PYELOGRAM, right URETEROSCOPY, left diagnostic ureteroscopy AND STENT PLACEMENT (Bilateral) HOLMIUM LASER APPLICATION (Bilateral)  SURGEON:  Surgeon(s) and Role:    Sebastian Ache* Imanuel Pruiett, MD - Primary  PHYSICIAN ASSISTANT:   ASSISTANTS: none   ANESTHESIA:   general  EBL:  Total I/O In: 1000 [I.V.:1000] Out: -   BLOOD ADMINISTERED:none  DRAINS: none   LOCAL MEDICATIONS USED:  NONE  SPECIMEN:  Source of Specimen:  Right renal stone fragmetns   DISPOSITION OF SPECIMEN:  Source of Specimen:  right renal stone fragments  COUNTS:  YES  TOURNIQUET:  * No tourniquets in log *  DICTATION: .Other Dictation: Dictation Number [id number]  PLAN OF CARE: Discharge to home after PACU  PATIENT DISPOSITION:  PACU - hemodynamically stable.   Delay start of Pharmacological VTE agent (>24hrs) due to surgical blood loss or risk of bleeding: yes

## 2017-07-15 NOTE — Anesthesia Procedure Notes (Signed)
Procedure Name: LMA Insertion Date/Time: 07/15/2017 2:18 PM Performed by: Jessica PriestBEESON, Tynisha Ogan C Pre-anesthesia Checklist: Patient identified, Emergency Drugs available, Suction available and Patient being monitored Patient Re-evaluated:Patient Re-evaluated prior to induction Oxygen Delivery Method: Circle system utilized Preoxygenation: Pre-oxygenation with 100% oxygen Induction Type: IV induction Ventilation: Mask ventilation without difficulty LMA: LMA inserted LMA Size: 4.0 Number of attempts: 1 Airway Equipment and Method: Bite block Placement Confirmation: positive ETCO2 and breath sounds checked- equal and bilateral Tube secured with: Tape Dental Injury: Teeth and Oropharynx as per pre-operative assessment

## 2017-07-15 NOTE — Anesthesia Postprocedure Evaluation (Signed)
Anesthesia Post Note  Patient: Jill Riley  Procedure(s) Performed: Procedure(s) (LRB): CYSTOSCOPY WITH bilateral RETROGRADE PYELOGRAM, right URETEROSCOPY, left diagnostic ureteroscopy AND STENT PLACEMENT (Bilateral) HOLMIUM LASER APPLICATION (Bilateral)     Patient location during evaluation: PACU Anesthesia Type: General Level of consciousness: awake and alert Pain management: pain level controlled Vital Signs Assessment: post-procedure vital signs reviewed and stable Respiratory status: spontaneous breathing, nonlabored ventilation, respiratory function stable and patient connected to nasal cannula oxygen Cardiovascular status: blood pressure returned to baseline and stable Postop Assessment: no signs of nausea or vomiting Anesthetic complications: no    Last Vitals:  Vitals:   07/15/17 1530 07/15/17 1540  BP: 117/71   Pulse: 89 94  Resp: 15 12  Temp:      Last Pain:  Vitals:   07/15/17 1530  TempSrc:   PainSc: 8                  Binyamin Nelis S

## 2017-07-15 NOTE — Anesthesia Preprocedure Evaluation (Signed)
Anesthesia Evaluation  Patient identified by MRN, date of birth, ID band Patient awake    Reviewed: Allergy & Precautions, NPO status , Patient's Chart, lab work & pertinent test results  Airway Mallampati: II  TM Distance: >3 FB Neck ROM: Full    Dental no notable dental hx.    Pulmonary neg pulmonary ROS,    Pulmonary exam normal breath sounds clear to auscultation       Cardiovascular negative cardio ROS Normal cardiovascular exam Rhythm:Regular Rate:Normal     Neuro/Psych negative neurological ROS  negative psych ROS   GI/Hepatic negative GI ROS, Neg liver ROS,   Endo/Other  negative endocrine ROS  Renal/GU negative Renal ROS  negative genitourinary   Musculoskeletal negative musculoskeletal ROS (+)   Abdominal   Peds negative pediatric ROS (+)  Hematology negative hematology ROS (+)   Anesthesia Other Findings   Reproductive/Obstetrics negative OB ROS                             Anesthesia Physical Anesthesia Plan  ASA: II  Anesthesia Plan: General   Post-op Pain Management:    Induction: Intravenous  PONV Risk Score and Plan: 2 and Ondansetron, Dexamethasone and Midazolam  Airway Management Planned: LMA  Additional Equipment:   Intra-op Plan:   Post-operative Plan: Extubation in OR  Informed Consent: I have reviewed the patients History and Physical, chart, labs and discussed the procedure including the risks, benefits and alternatives for the proposed anesthesia with the patient or authorized representative who has indicated his/her understanding and acceptance.   Dental advisory given  Plan Discussed with: CRNA and Surgeon  Anesthesia Plan Comments:         Anesthesia Quick Evaluation

## 2017-07-16 ENCOUNTER — Encounter (HOSPITAL_BASED_OUTPATIENT_CLINIC_OR_DEPARTMENT_OTHER): Payer: Self-pay | Admitting: Urology

## 2017-07-16 NOTE — Op Note (Signed)
NAME:  Jill Riley, Jill           ACCOUNT NO.:  MEDICAL RECORD NO.:  [id number]  LOCATION:                                 FACILITY:  PHYSICIAN:  Sebastian Acheheodore Emelina Hinch, MD     DATE OF BIRTH:  [birth year]  DATE OF PROCEDURE: 07/15/2017                              OPERATIVE REPORT   PREOPERATIVE DIAGNOSIS:  Left greater than right intrarenal stones, recurrent abdominal and flank pain.  PROCEDURES: 1. Cystoscopy with bilateral retrograde pyelogram and interpretation. 2. Right ureteroscopy with laser lithotripsy and stent placement. 3. Left diagnostic ureteroscopy. 4. Left ureteral stent placement.  ESTIMATED BLOOD LOSS:  Nil.  COMPLICATION:  None.  SPECIMENS:  Right renal stone fragments for compositional analysis.  FINDINGS: 1. Somewhat narrow and tight ureters, left greater than right at the     area of the iliac crossing.  This did not allow laser lithotripsy     on the left side, but did on the right. 2. Otherwise, unremarkable bilateral retrograde pyelograms. 3. Successful placement of bilateral ureteral stents, proximal end in     the renal pelvis and distal end in the urinary bladder.  INDICATION:  Jill Riley is a very pleasant 31 year old Hispanic lady, who was found on workup of colicky abdominal pain to have left greater than right intrarenal stones.  These did not appear to be acutely obstructing; however, her symptoms were quite concerning for possible intermittent obstruction.  Options were discussed for management including observation versus shockwave lithotripsy versus ureteroscopy, and she wished to proceed with the latter with goal of stone free.  Informed consent was obtained and placed in the medical record.  PROCEDURE IN DETAIL:  The patient being, Jill Riley, verified.  Procedure being bilateral ureteroscopic stone manipulation was confirmed.  Procedure was carried out.  Time-out was performed. Intravenous antibiotics were  administered.  General LMA anesthesia was introduced.  The patient was placed into a low lithotomy position and sterile field was created by prepping and draping the patient's vagina, introitus, and proximal thighs using iodine.  Next, cystourethroscopy was performed using a rigid cystoscope with offset lens.  Inspection of the urinary bladder revealed no diverticula, calcifications, papillary lesions.  There was expected estrogenization of the area of the trigone.  The right ureteral orifice was cannulated with a 6-French end-hole catheter and right retrograde pyelogram was obtained.  Right retrograde pyelogram demonstrated a single right ureter with single-system right kidney.  No filling defects or narrowing noted.  A 0.038 ZIP wire was advanced to the level of the lower pole, set aside as a safety wire.  Left retrograde pyelogram was obtained.  Left retrograde pyelogram demonstrated a single left ureter with single- system left kidney.  There was a calcification from filling defect in lower pole consistent with known stone.  No filling defects or narrowing noted.  A 0.038 ZIP wire was advanced to the level of the upper pole, set aside as a safety wire.  An 8-French feeding tube was placed in the urinary bladder for pressure release.  Next, semi-rigid ureteroscopy was performed to the distal four-fifths of the right ureter alongside a separate Sensor working wire.  There was some mild narrowing of the ureter; however, did easily accommodate  the semi-rigid scope to level of the proximal ureter with a train tracking technique.  A Sensor wire was set aside on the right acting as a working wire.  Similarly, a semi-rigid ureteroscopy was performed to the distal four-fifths of the left ureter alongside a separate Sensor working wire. Incidentally, there was some mild narrowing, but this did easily accommodate the semi-rigid scope with a train tracking technique, and a separate Sensor  wire was left on the left side as a working wire.  Next, a 12/14 ureteral access sheath was carefully placed on the right side over the working wire to the level of proximal ureter using continuous fluoroscopic guidance.  Proximal inspection was performed using a flexible ureteroscope including systematic inspection of the right kidney including all calices x3.  Two foci of stone were noted, a small papillary tip calcification in an upper midpole and a larger dominant stone in lower pole calyx.  The upper papillary tip calcification was amenable to simple basketing.  This was removed and set aside for compositional analysis.  The lower pole stone appeared to be too large for this.  It was grasped with a basket and repositioned into the upper pole for more favorable angulation.  Holmium laser lithotripsy was performed of the stone using settings of 0.3 joules and 30 Hz fragmenting the stone into 3 smaller pieces, which were then sequentially grasped on the long axis removed, set aside for compositional analysis.  Repeat systematic inspection of the right kidney revealed complete resolution of all stone fragments.  The access sheath was removed under continuous vision and no significant mucosal abnormalities were seen.  Attempt was then made to pass the access sheath of the left ureter; however, there was significant resistance in the area of the iliac crossing.  This was in the area that was previously noted to be relatively narrow but without focal stricture. Given this, it was felt that the safest means of management would be stenting to allow for passive dilation and then proceeded in a staged approach with left ureteroscopic stone manipulation at a later date as her left stone burden is quite significant.  As such, a new 6 x 24 Contour-type stent was placed with remaining safety wire on the left side using fluoroscopic guidance.  Good proximal and distal deployment were noted.   Similarly, a separate 6 x 24 contour type stent was placed on the right side using fluoroscopic guidance.  Good proximal and distal deployment were noted.  Bladder was emptied per cystoscope, procedure was then terminated.  The patient tolerated procedure well.  There were no immediate periprocedural complications.  The patient was taken to the postanesthesia care unit in stable condition.          ______________________________ Sebastian Acheheodore Alysia Scism, MD     TM/MEDQ  D:  07/15/2017  T:  07/15/2017  Job:  [id number]

## 2017-07-20 ENCOUNTER — Other Ambulatory Visit: Payer: Self-pay | Admitting: Urology

## 2017-07-22 ENCOUNTER — Encounter (HOSPITAL_BASED_OUTPATIENT_CLINIC_OR_DEPARTMENT_OTHER): Payer: Self-pay | Admitting: *Deleted

## 2017-07-22 NOTE — Progress Notes (Addendum)
SPOKE W/ PT'S Jill HammingHUSBAND, THRU SPANISH PACIFIC INTERPRETER  [phone]#[id number].  NPO AFTER MN.  ARRIVE AT 1000.  NEEDS ISTAT 8.  MAY TAKE PAIN RX IF NEEDED AM DOS W/ SIPS OF WATER.  REQUESTED SPANISH INTERPRETER TO ARRIVE AT 0945 AND WILL PLACE CONFIRMATION ON CHART.

## 2017-07-29 ENCOUNTER — Ambulatory Visit (HOSPITAL_BASED_OUTPATIENT_CLINIC_OR_DEPARTMENT_OTHER): Payer: Medicaid Other | Admitting: Anesthesiology

## 2017-07-29 ENCOUNTER — Encounter (HOSPITAL_BASED_OUTPATIENT_CLINIC_OR_DEPARTMENT_OTHER): Payer: Self-pay

## 2017-07-29 ENCOUNTER — Encounter (HOSPITAL_BASED_OUTPATIENT_CLINIC_OR_DEPARTMENT_OTHER)
Admission: RE | Disposition: A | Discharge status: Home / Self Care | Payer: Self-pay | Source: Ambulatory Visit | Attending: Urology

## 2017-07-29 ENCOUNTER — Ambulatory Visit (HOSPITAL_BASED_OUTPATIENT_CLINIC_OR_DEPARTMENT_OTHER)
Admission: RE | Admit: 2017-07-29 | Discharge: 2017-07-29 | Disposition: A | Discharge status: Home / Self Care | Payer: Medicaid Other | Source: Ambulatory Visit | Attending: Urology | Admitting: Urology

## 2017-07-29 DIAGNOSIS — N200 Calculus of kidney: Secondary | ICD-10-CM | POA: Diagnosis present

## 2017-07-29 DIAGNOSIS — Z87442 Personal history of urinary calculi: Secondary | ICD-10-CM | POA: Diagnosis not present

## 2017-07-29 HISTORY — DX: Personal history of urinary calculi: Z87.442

## 2017-07-29 HISTORY — PX: CYSTOSCOPY W/ URETERAL STENT REMOVAL: SHX1430

## 2017-07-29 HISTORY — DX: Dysuria: R30.0

## 2017-07-29 HISTORY — DX: Flank pain, unspecified side: R10.A0

## 2017-07-29 HISTORY — PX: CYSTOSCOPY WITH RETROGRADE PYELOGRAM, URETEROSCOPY AND STENT PLACEMENT: SHX5789

## 2017-07-29 HISTORY — PX: HOLMIUM LASER APPLICATION: SHX5852

## 2017-07-29 HISTORY — DX: Presence of urogenital implants: Z96.0

## 2017-07-29 HISTORY — DX: Calculus of ureter: N20.1

## 2017-07-29 HISTORY — DX: Unspecified abdominal pain: R10.9

## 2017-07-29 LAB — POCT I-STAT, CHEM 8
BUN: 21 mg/dL — ABNORMAL HIGH (ref 6–20)
CREATININE: 0.5 mg/dL (ref 0.44–1.00)
Calcium, Ion: 1.26 mmol/L (ref 1.15–1.40)
Chloride: 103 mmol/L (ref 101–111)
GLUCOSE: 91 mg/dL (ref 65–99)
HCT: 41 % (ref 36.0–46.0)
HEMOGLOBIN: 13.9 g/dL (ref 12.0–15.0)
Potassium: 4.1 mmol/L (ref 3.5–5.1)
Sodium: 137 mmol/L (ref 135–145)
TCO2: 24 mmol/L (ref 0–100)

## 2017-07-29 SURGERY — CYSTOURETEROSCOPY, WITH RETROGRADE PYELOGRAM AND STENT INSERTION
Anesthesia: General | Site: Ureter | Laterality: Left

## 2017-07-29 MED ORDER — PROPOFOL 10 MG/ML IV BOLUS
INTRAVENOUS | Status: AC
  Filled 2017-07-29: qty 20

## 2017-07-29 MED ORDER — LIDOCAINE 2% (20 MG/ML) 5 ML SYRINGE
INTRAMUSCULAR | Status: AC
  Filled 2017-07-29: qty 5

## 2017-07-29 MED ORDER — IOHEXOL 300 MG/ML  SOLN
INTRAMUSCULAR | Status: DC | PRN
  Administered 2017-07-29: 22 mL via URETHRAL

## 2017-07-29 MED ORDER — FENTANYL CITRATE (PF) 100 MCG/2ML IJ SOLN
INTRAMUSCULAR | Status: AC
  Filled 2017-07-29: qty 2

## 2017-07-29 MED ORDER — KETOROLAC TROMETHAMINE 30 MG/ML IJ SOLN
INTRAMUSCULAR | Status: AC
  Filled 2017-07-29: qty 1

## 2017-07-29 MED ORDER — KETOROLAC TROMETHAMINE 10 MG PO TABS: 10.0000 mg | ORAL_TABLET | Freq: Four times a day (QID) | ORAL | 1 refills | Status: DC | PRN

## 2017-07-29 MED ORDER — DEXAMETHASONE SODIUM PHOSPHATE 10 MG/ML IJ SOLN
INTRAMUSCULAR | Status: AC
  Filled 2017-07-29: qty 1

## 2017-07-29 MED ORDER — GENTAMICIN SULFATE 40 MG/ML IJ SOLN
5.0000 mg/kg | INTRAVENOUS | Status: DC
  Administered 2017-07-29: 330 mg via INTRAVENOUS
  Filled 2017-07-29 (×2): qty 8.25

## 2017-07-29 MED ORDER — DEXTROSE 5 % IV SOLN
5.0000 mg/kg | INTRAVENOUS | Status: DC
  Filled 2017-07-29: qty 11

## 2017-07-29 MED ORDER — PROPOFOL 10 MG/ML IV BOLUS
INTRAVENOUS | Status: DC | PRN
  Administered 2017-07-29: 200 mg via INTRAVENOUS
  Administered 2017-07-29: 60 mg via INTRAVENOUS
  Administered 2017-07-29: 100 mg via INTRAVENOUS

## 2017-07-29 MED ORDER — CEPHALEXIN 500 MG PO CAPS: 500.0000 mg | ORAL_CAPSULE | Freq: Two times a day (BID) | ORAL | 0 refills | Status: DC

## 2017-07-29 MED ORDER — ONDANSETRON HCL 4 MG/2ML IJ SOLN
4.0000 mg | Freq: Once | INTRAMUSCULAR | Status: DC | PRN
  Filled 2017-07-29: qty 2

## 2017-07-29 MED ORDER — OXYCODONE-ACETAMINOPHEN 5-325 MG PO TABS: 1.0000 | ORAL_TABLET | Freq: Four times a day (QID) | ORAL | 0 refills | Status: AC | PRN

## 2017-07-29 MED ORDER — ONDANSETRON HCL 4 MG/2ML IJ SOLN
INTRAMUSCULAR | Status: AC
  Filled 2017-07-29: qty 2

## 2017-07-29 MED ORDER — LACTATED RINGERS IV SOLN
INTRAVENOUS | Status: DC
  Administered 2017-07-29 (×2): via INTRAVENOUS
  Filled 2017-07-29: qty 1000

## 2017-07-29 MED ORDER — DEXAMETHASONE SODIUM PHOSPHATE 4 MG/ML IJ SOLN
INTRAMUSCULAR | Status: DC | PRN
  Administered 2017-07-29: 10 mg via INTRAVENOUS

## 2017-07-29 MED ORDER — FENTANYL CITRATE (PF) 100 MCG/2ML IJ SOLN
INTRAMUSCULAR | Status: DC | PRN
  Administered 2017-07-29 (×4): 25 ug via INTRAVENOUS
  Administered 2017-07-29: 50 ug via INTRAVENOUS
  Administered 2017-07-29 (×2): 25 ug via INTRAVENOUS

## 2017-07-29 MED ORDER — LIDOCAINE 2% (20 MG/ML) 5 ML SYRINGE
INTRAMUSCULAR | Status: DC | PRN
  Administered 2017-07-29: 100 mg via INTRAVENOUS

## 2017-07-29 MED ORDER — MEPERIDINE HCL 25 MG/ML IJ SOLN
6.2500 mg | INTRAMUSCULAR | Status: DC | PRN
  Filled 2017-07-29: qty 1

## 2017-07-29 MED ORDER — ONDANSETRON HCL 4 MG/2ML IJ SOLN
INTRAMUSCULAR | Status: DC | PRN
  Administered 2017-07-29: 4 mg via INTRAVENOUS

## 2017-07-29 MED ORDER — FENTANYL CITRATE (PF) 100 MCG/2ML IJ SOLN
25.0000 ug | INTRAMUSCULAR | Status: DC | PRN
  Administered 2017-07-29 (×2): 25 ug via INTRAVENOUS
  Filled 2017-07-29: qty 1

## 2017-07-29 MED ORDER — KETOROLAC TROMETHAMINE 30 MG/ML IJ SOLN
INTRAMUSCULAR | Status: DC | PRN
  Administered 2017-07-29: 30 mg via INTRAVENOUS

## 2017-07-29 SURGICAL SUPPLY — 23 items
BAG DRAIN URO-CYSTO SKYTR STRL (DRAIN) ×5 IMPLANT
BASKET STONE NCOMPASS (UROLOGICAL SUPPLIES) ×5 IMPLANT
CATH INTERMIT  6FR 70CM (CATHETERS) ×5 IMPLANT
CLOTH BEACON ORANGE TIMEOUT ST (SAFETY) ×5 IMPLANT
FIBER LASER TRAC TIP (UROLOGICAL SUPPLIES) ×5 IMPLANT
GLOVE BIO SURGEON STRL SZ7.5 (GLOVE) ×5 IMPLANT
GOWN STRL REUS W/TWL XL LVL3 (GOWN DISPOSABLE) ×10 IMPLANT
GUIDEWIRE ANG ZIPWIRE 038X150 (WIRE) ×10 IMPLANT
GUIDEWIRE STR DUAL SENSOR (WIRE) ×10 IMPLANT
INFUSOR MANOMETER BAG 3000ML (MISCELLANEOUS) ×5 IMPLANT
IV NS 1000ML (IV SOLUTION) ×2
IV NS 1000ML BAXH (IV SOLUTION) ×3 IMPLANT
IV NS IRRIG 3000ML ARTHROMATIC (IV SOLUTION) ×5 IMPLANT
KIT RM TURNOVER CYSTO AR (KITS) ×5 IMPLANT
MANIFOLD NEPTUNE II (INSTRUMENTS) IMPLANT
NS IRRIG 500ML POUR BTL (IV SOLUTION) ×5 IMPLANT
PACK CYSTO (CUSTOM PROCEDURE TRAY) ×5 IMPLANT
SHEATH ACCESS URETERAL 24CM (SHEATH) ×5 IMPLANT
STENT POLARIS 5FRX24 (STENTS) ×5 IMPLANT
SYRINGE 10CC LL (SYRINGE) ×5 IMPLANT
TUBE CONNECTING 12'X1/4 (SUCTIONS)
TUBE CONNECTING 12X1/4 (SUCTIONS) IMPLANT
TUBE FEEDING 8FR 16IN STR KANG (MISCELLANEOUS) ×5 IMPLANT

## 2017-07-29 NOTE — Anesthesia Preprocedure Evaluation (Signed)
Anesthesia Evaluation  Patient identified by MRN, date of birth, ID band Patient awake    Reviewed: Allergy & Precautions, NPO status , Patient's Chart, lab work & pertinent test results  Airway Mallampati: II  TM Distance: >3 FB Neck ROM: Full    Dental no notable dental hx.    Pulmonary neg pulmonary ROS,    Pulmonary exam normal breath sounds clear to auscultation       Cardiovascular negative cardio ROS Normal cardiovascular exam Rhythm:Regular Rate:Normal     Neuro/Psych negative neurological ROS  negative psych ROS   GI/Hepatic negative GI ROS, Neg liver ROS,   Endo/Other  negative endocrine ROS  Renal/GU negative Renal ROS  negative genitourinary   Musculoskeletal negative musculoskeletal ROS (+)   Abdominal   Peds negative pediatric ROS (+)  Hematology negative hematology ROS (+)   Anesthesia Other Findings   Reproductive/Obstetrics negative OB ROS                             Anesthesia Physical  Anesthesia Plan  ASA: II  Anesthesia Plan: General   Post-op Pain Management:    Induction: Intravenous  PONV Risk Score and Plan: 2 and Ondansetron, Dexamethasone, Midazolam and Treatment may vary due to age or medical condition  Airway Management Planned: LMA  Additional Equipment:   Intra-op Plan:   Post-operative Plan: Extubation in OR  Informed Consent: I have reviewed the patients History and Physical, chart, labs and discussed the procedure including the risks, benefits and alternatives for the proposed anesthesia with the patient or authorized representative who has indicated his/her understanding and acceptance.   Dental advisory given  Plan Discussed with: CRNA and Surgeon  Anesthesia Plan Comments:         Anesthesia Quick Evaluation

## 2017-07-29 NOTE — Brief Op Note (Signed)
07/29/2017  1:05 PM  PATIENT:  Alyana Saran  31 y.o. female  PRE-OPERATIVE DIAGNOSIS:  RESIDUAL LEFT RENAL STONE, RETAINED RIGHT URETERAL STENT  POST-OPERATIVE DIAGNOSIS:  STONE, RETAINED RIGHT URETERAL STENT  PROCEDURE:  Procedure(s): CYSTOSCOPY WITH RETROGRADE PYELOGRAM, URETEROSCOPY AND STENT REPLACEMENT LEFT URETER (Left) CYSTOSCOPY WITH STENT REMOVAL (Bilateral) HOLMIUM LASER APPLICATION (Left)  SURGEON:  Surgeon(s) and Role:    Sebastian Ache* Likisha Alles, MD - Primary  PHYSICIAN ASSISTANT:   ASSISTANTS: none   ANESTHESIA:   general  EBL:  Total I/O In: 1000 [I.V.:1000] Out: -   BLOOD ADMINISTERED:none  DRAINS: none   LOCAL MEDICATIONS USED:  NONE  SPECIMEN:  Source of Specimen:  left renal stone fragments  DISPOSITION OF SPECIMEN:  given to patient  COUNTS:  YES  TOURNIQUET:  * No tourniquets in log *  DICTATION: .Other Dictation: Dictation Number [id number]  PLAN OF CARE: Discharge to home after PACU  PATIENT DISPOSITION:  PACU - hemodynamically stable.   Delay start of Pharmacological VTE agent (>24hrs) due to surgical blood loss or risk of bleeding: yes

## 2017-07-29 NOTE — Progress Notes (Signed)
preop completed through interperter [employer] Herrera

## 2017-07-29 NOTE — Discharge Instructions (Signed)
1 - You may have urinary urgency (bladder spasms) and bloody urine on / off with stent in place. This is normal.  2 - Remove tethered stent on Friday morning at home by pulling on string, then blue-white plastic tubing, and discarding. Office is open Friday if any issues arise.   3 - Call MD or go to ER for fever >102, severe pain / nausea / vomiting not relieved by medications, or acute change in medical status.   

## 2017-07-29 NOTE — H&P (Signed)
Jill Riley is an 31 y.o. female.    Chief Complaint: Pre-op LEFT Ureteroscopic Stone Manipulaiton, RIght ureteral stent removal  HPI:   1 - Nephrolithiasis - Rt 4mm papillary tip (non-obstructing) and Lt lower mid 13mm (non-obstructing) stones by ER CT 06/2017 on eval LLQ pain. Stones 1200 HU, SSD 10cm. NO hydro whatsoever.   She had attempt at bilateral ureteroscoyp 8/1, however her left ureter was quite narrow, not allowing access to kidney, right side treated and both sides stented to allow for passive dilation.  PMH sig for BTL, hand surgery. NO CV disease / blood thinners. 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   Today "Jill Riley" is seen to proceed with re-attempt left ureteroscopic stone manipulation after interval stenting to allow for passive ureteral dilation. No interval fevers. She has had significant stent colic.   Past Medical History:  Diagnosis Date  . Dysuria   . Flank pain   . History of kidney stones   . Left ureteral calculus    residual stone s/p  1st stage laser lithotripsy 07-15-2017  . Retained ureteral stent    right side  . Wears glasses     Past Surgical History:  Procedure Laterality Date  . CYSTOSCOPY WITH RETROGRADE PYELOGRAM, URETEROSCOPY AND STENT PLACEMENT Bilateral 07/15/2017   Procedure: CYSTOSCOPY WITH bilateral RETROGRADE PYELOGRAM, right URETEROSCOPY, left diagnostic ureteroscopy AND STENT PLACEMENT;  Surgeon: Sebastian AcheManny, Adamary Savary, MD;  [facility];  Service: Urology;  Laterality: Bilateral;  . HOLMIUM LASER APPLICATION Bilateral 07/15/2017   Procedure: HOLMIUM LASER APPLICATION;  Surgeon: Sebastian AcheManny, Jaxsyn Catalfamo, MD;  [facility];  Service: Urology;  Laterality: Bilateral;  . TENDON REPAIR Left 04/21/2017   Procedure: LEFT THUMB REPAIR EXTENSOR  TENDON;  Surgeon: Betha LoaKuzma, Kevin, MD;  Location: [facility];  Service: Orthopedics;  Laterality: Left;  . TUBAL  LIGATION [place]ilateral 2013   [place]   "had procedure to not have children any more"    History reviewed. No pertinent family history. Social History:  reports that she has never smoked. She has never used smokeless tobacco. She reports that she does not drink alcohol or use drugs.  Allergies: No Known Allergies  No prescriptions prior to admission.    No results found for this or any previous visit (from the past 48 hour(s)). No results found.  Review of Systems  Constitutional: Negative.  Negative for fever.  HENT: Negative.   Eyes: Negative.   Respiratory: Negative.   Cardiovascular: Negative.   Gastrointestinal: Negative.   Genitourinary: Negative.   Musculoskeletal: Negative.   Skin: Negative.   Endo/Heme/Allergies: Negative.   Psychiatric/Behavioral: Negative.     Last menstrual period 07/22/2017. Physical Exam  Constitutional: She appears well-developed.  HENT:  Head: Normocephalic.  Eyes: Pupils are equal, round, and reactive to light.  Neck: Normal range of motion.  Cardiovascular: Normal rate.   Respiratory: Effort normal.  GI: Soft.  Genitourinary:  Genitourinary Comments: Very mild CVAT at presnt.   Musculoskeletal: Normal range of motion.  Neurological: She is alert.  Skin: Skin is warm.  Psychiatric: She has a normal mood and affect.     Assessment/Plan  Proceed as planned with LEFT ureteroscopic stone manipulation and right stent pull. Risks, benefits, alternatives, expected peri-op course discussed previously and reiteratd today.   Sebastian AcheMANNY, Daemyn Gariepy, MD 07/29/2017, 7:55 AM

## 2017-07-29 NOTE — Anesthesia Procedure Notes (Signed)
Procedure Name: LMA Insertion Date/Time: 07/29/2017 12:03 PM Performed by: Jessica PriestBEESON, Shanda Cadotte C Pre-anesthesia Checklist: Patient identified, Emergency Drugs available, Suction available and Patient being monitored Patient Re-evaluated:Patient Re-evaluated prior to induction Oxygen Delivery Method: Circle system utilized Preoxygenation: Pre-oxygenation with 100% oxygen Induction Type: IV induction Ventilation: Mask ventilation without difficulty LMA: LMA inserted LMA Size: 4.0 Number of attempts: 1 Airway Equipment and Method: Bite block Placement Confirmation: positive ETCO2 and breath sounds checked- equal and bilateral Tube secured with: Tape Dental Injury: Teeth and Oropharynx as per pre-operative assessment

## 2017-07-29 NOTE — Interval H&P Note (Signed)
History and Physical Interval Note:  07/29/2017 11:45 AM  Jill Riley  has presented today for surgery, with the diagnosis of RESIDUAL LEFT RENAL STONE, RETAINED RIGHT URETERAL STENT  The various methods of treatment have been discussed with the patient and family. After consideration of risks, benefits and other options for treatment, the patient has consented to  Procedure(s): CYSTOSCOPY WITH RETROGRADE PYELOGRAM, URETEROSCOPY AND STENT REPLACEMENT (Left) CYSTOSCOPY WITH STENT REMOVAL (Right) HOLMIUM LASER APPLICATION (N/A) as a surgical intervention .  The patient's history has been reviewed, patient examined, no change in status, stable for surgery.  I have reviewed the patient's chart and labs.  Questions were answered to the patient's satisfaction.     Dacey Milberger

## 2017-07-29 NOTE — Transfer of Care (Signed)
Immediate Anesthesia Transfer of Care Note  Patient: Jill Riley  Procedure(s) Performed: Procedure(s) (LRB): CYSTOSCOPY WITH RETROGRADE PYELOGRAM, URETEROSCOPY AND STENT REPLACEMENT LEFT URETER (Left) CYSTOSCOPY WITH STENT REMOVAL (Bilateral) HOLMIUM LASER APPLICATION (Left)  Patient Location: PACU  Anesthesia Type: General  Level of Consciousness: awake, sedated, patient cooperative and responds to stimulation  Airway & Oxygen Therapy: Patient Spontanous Breathing and Patient connected to [place] O2  Post-op Assessment: Report given to PACU RN, Post -op Vital signs reviewed and stable and Patient moving all extremities  Post vital signs: Reviewed and stable  Complications: No apparent anesthesia complications

## 2017-07-30 ENCOUNTER — Encounter (HOSPITAL_BASED_OUTPATIENT_CLINIC_OR_DEPARTMENT_OTHER): Payer: Self-pay | Admitting: Urology

## 2017-07-30 NOTE — Op Note (Signed)
NAME:  Jill Riley, Jill NO.:  MEDICAL RECORD NO.:  [id number]  LOCATION:                                 FACILITY:  PHYSICIAN:  Sebastian Ache, MD     DATE OF BIRTH:  12-Sep-1986  DATE OF PROCEDURE:  07/29/2017                               OPERATIVE REPORT   DIAGNOSES:  Retained left renal stone, history of bilateral nephrolithiasis.  PROCEDURES: 1. Cystoscopy with bilateral retrograde pyelograms and interpretation. 2. Right ureteral stent removal. 3. Left ureteroscopy with laser lithotripsy, and stent exchange.  ESTIMATED BLOOD LOSS:  Nil.  COMPLICATION:  None.  SPECIMEN:  Left renal stone fragments given the patient.  FINDINGS: 1. Unremarkable urinary bladder. 2. Unremarkable right retrograde pyelograms. 3. Successful removal of right ureteral stent. 4. Interval excellent passive dilation of left ureter. 5. Complete resolution of all left-sided stone fragments, larger than     1/3rd mm following laser lithotripsy and basket extraction. 6. Successful replacement of left ureteral stent, proximal in the     renal pelvis and distal in the urinary bladder.  INDICATION:  Ms. Jill Riley is very pleasant 31 year old woman with recent history of bilateral symptomatic nephrolithiasis.  She adamantly desired past the stone free.  She underwent attempted bilateral ureteroscopic stone stimulation approximately 2 weeks ago, at which point, her right side was rendered stone free; however, her left ureter was quite narrow and did not allow access to the kidney.  Therefore, she underwent bilateral stenting to allow for left-sided passive dilation with re-attempt ureteroscopy at a later time in a staged fashion.  She presents for this today.  Informed consent was obtained and placed in the medical record.  PROCEDURE IN DETAIL:  The patient being [name], was verified.  Procedure being left ureteroscopic stone manipulation, right ureteral  stent pull was confirmed.  Procedure was carried out.  Time-out was performed.  Intravenous antibiotics were administered.  General anesthesia introduced.  The patient was placed into a low lithotomy position and sterile field was created by prepping and draping the patient's vagina, introitus and proximal thighs using iodine.  Next, cystourethroscopy was performed using a 22-French rigid cystoscope with offset lens.  Inspection of the urinary bladder revealed distal end of bilateral ureteral stents in situ; otherwise, unremarkable.  The distal end of the right stent was grasped, brought to the level of the urethral meatus, through which, a 0.038 Zip wire was advanced to the level of the lower pole, was exchanged for an open-ended catheter and right retrograde pyelogram was obtained.  Right retrograde pyelogram demonstrated a single right ureter with single-system right kidney.  No filling defects or narrowing noted.  It was therefore removed with the right stent remaining out.  Next, the distal end of the left stent was grasped, brought to the level of the urethral meatus, through which, a 0.038 Zip wire was advanced to the level of the upper pole and exchanged for an open-ended catheter and left retrograde pyelogram was obtained.  Left retrograde pyelogram demonstrated a single left ureter with single- system left kidney.  There was a calcification turned filling defect in the lower mid-pole calyx consistent with known stone.  The Zip wire was once again  advanced to the level of the upper pole, set aside as a safety wire.  An 8-French feeding tube placed in the urinary bladder for pressure release.  Next, semi-rigid ureteroscopy was performed to the distal four-fifths of the left ureter alongside a separate Sensor working wire.  No mucosal abnormalities were found.  The semi-rigid scope was exchanged for 12/14, 24-cm ureteral access sheath using continuous fluoroscopic guidance at the  level of the proximal ureter and flexible digital ureteroscopy was performed of the proximal left ureter and systematic inspection of the left kidney including all calices x3. There was, as anticipated, a single large dominant calcification in lower mid-pole calyx.  This appeared to be possibly intermittently obstructing this lower mid infundibulum.  It appeared to be much too large for simple basketing.  As such, holmium laser energy applied to the stone using settings of 0.3 joules and 30 Hz, and a dusting technique was used to ablate approximately 70% of volume of the stone. Remaining approximately 30% volume was fragmented into pieces approximately 0.5 to 1 mm in diameter, and then sequentially grasped with an Encompass-type basket, removed and set aside to given to the patient as we already have compositional analysis from a contralateral side.  Following these maneuvers, there was excellent hemostasis, no evidence of renal perforation, there was complete resolution of all stone fragments larger than 1/3rd mm.  The access sheath was removed under continuous vision.  No mucosal abnormalities were found.  Given access sheath usage, it was felt that brief interval stenting would be warranted with a tethered stent.  As such, a new 5 x 24 Polaris-type stent was placed over the remaining safety wire using fluoroscopic guidance.  Good proximal and distal deployment were noted and the procedure was terminated.  The patient tolerated the procedure well. There were no immediate periprocedural complications.  The patient was taken to the postanesthesia care unit in stable condition.          ______________________________ Sebastian Acheheodore Kattia Selley, MD     TM/MEDQ  D:  07/29/2017  T:  07/30/2017  Job:  [id number]

## 2017-08-04 ENCOUNTER — Encounter (HOSPITAL_BASED_OUTPATIENT_CLINIC_OR_DEPARTMENT_OTHER): Payer: Self-pay | Admitting: Urology

## 2017-08-10 NOTE — Anesthesia Postprocedure Evaluation (Signed)
Anesthesia Post Note  Patient: Jill Riley  Procedure(s) Performed: Procedure(s) (LRB): CYSTOSCOPY WITH RETROGRADE PYELOGRAM, URETEROSCOPY AND STENT REPLACEMENT LEFT URETER (Left) CYSTOSCOPY WITH STENT REMOVAL (Bilateral) HOLMIUM LASER APPLICATION (Left)     Patient location during evaluation: PACU Anesthesia Type: General Level of consciousness: awake and alert Pain management: pain level controlled Vital Signs Assessment: post-procedure vital signs reviewed and stable Respiratory status: spontaneous breathing, nonlabored ventilation, respiratory function stable and patient connected to nasal cannula oxygen Cardiovascular status: blood pressure returned to baseline and stable Postop Assessment: no signs of nausea or vomiting Anesthetic complications: no    Last Vitals:  Vitals:   07/29/17 1415 07/29/17 1500  BP: 130/83 112/70  Pulse: 89 98  Resp: 14 16  Temp:  36.9 C  SpO2: 96%     Last Pain:  Vitals:   07/29/17 1515  TempSrc:   PainSc: 0-No pain                 Max Romano,JAMES TERRILL

## 2018-05-24 ENCOUNTER — Emergency Department (HOSPITAL_COMMUNITY)
Admission: EM | Admit: 2018-05-24 | Discharge: 2018-05-24 | Disposition: A | Discharge status: Home / Self Care | Payer: Self-pay | Attending: Emergency Medicine | Admitting: Emergency Medicine

## 2018-05-24 ENCOUNTER — Encounter (HOSPITAL_COMMUNITY): Payer: Self-pay | Admitting: Emergency Medicine

## 2018-05-24 DIAGNOSIS — M5416 Radiculopathy, lumbar region: Secondary | ICD-10-CM | POA: Insufficient documentation

## 2018-05-24 MED ORDER — METHOCARBAMOL 500 MG PO TABS: 1000.0000 mg | ORAL_TABLET | Freq: Four times a day (QID) | ORAL | 0 refills | Status: DC

## 2018-05-24 MED ORDER — PREDNISONE 20 MG PO TABS: ORAL_TABLET | ORAL | 0 refills | Status: DC

## 2018-05-24 MED ORDER — KETOROLAC TROMETHAMINE 60 MG/2ML IM SOLN
30.0000 mg | Freq: Once | INTRAMUSCULAR | Status: AC
  Administered 2018-05-24: 30 mg via INTRAMUSCULAR
  Filled 2018-05-24: qty 2

## 2018-05-24 MED ORDER — PREDNISONE 20 MG PO TABS
40.0000 mg | ORAL_TABLET | Freq: Once | ORAL | Status: AC
  Administered 2018-05-24: 40 mg via ORAL
  Filled 2018-05-24: qty 2

## 2018-05-24 NOTE — ED Triage Notes (Signed)
Pt c/o back x 2 weeks. Pt denies and injury to the site. Pt c/o pain when sitting. Pt ambulatory. When pt lifts up L leg it hurts back more.

## 2018-05-24 NOTE — ED Provider Notes (Signed)
[facility] EMERGENCY DEPARTMENT Provider Note   CSN: [id number] Arrival date & time: 05/24/18  1728     History   Chief Complaint Chief Complaint  Patient presents with  . Back Pain    HPI Jill Riley is a 32 y.o. female.  Patient with history of kidney stones presents with complaint of back pain.  She has had the pain on and off for the past month.  She has had milder episodes prior to this.  Pain is across the lower part of her back.  Pain occasionally will radiate down into her left leg.  No injuries to the area.  Pain is worse when she sits or stands for long period of time.  It is worse when she walks and moves.  She has never had an evaluation of her back.  Patient denies warning symptoms of back pain including: fecal incontinence, urinary retention or overflow incontinence, night sweats, waking from sleep with back pain, unexplained fevers or weight loss, h/o cancer, IVDU, recent trauma.   Patient states she has had irregular periods for the past year.  She asked if this could be contributing to her back pain.  She does not have a primary care doctor or a gynecologist.      Past Medical History:  Diagnosis Date  . Dysuria   . Flank pain   . History of kidney stones   . Left ureteral calculus    residual stone s/p  1st stage laser lithotripsy 07-15-2017  . Retained ureteral stent    right side  . Wears glasses     There are no active problems to display for this patient.   Past Surgical History:  Procedure Laterality Date  . CYSTOSCOPY W/ URETERAL STENT REMOVAL Bilateral 07/29/2017   Procedure: CYSTOSCOPY WITH STENT REMOVAL;  Surgeon: Sebastian Ache, MD;  Location: [facility];  Service: Urology;  Laterality: Bilateral;  . CYSTOSCOPY WITH RETROGRADE PYELOGRAM, URETEROSCOPY AND STENT PLACEMENT Bilateral 07/15/2017   Procedure: CYSTOSCOPY WITH bilateral RETROGRADE PYELOGRAM, right URETEROSCOPY, left diagnostic  ureteroscopy AND STENT PLACEMENT;  Surgeon: Sebastian Ache, MD;  Location: [facility];  Service: Urology;  Laterality: Bilateral;  . CYSTOSCOPY WITH RETROGRADE PYELOGRAM, URETEROSCOPY AND STENT PLACEMENT Left 07/29/2017   Procedure: CYSTOSCOPY WITH RETROGRADE PYELOGRAM, URETEROSCOPY AND STENT REPLACEMENT LEFT URETER;  Surgeon: Sebastian Ache, MD;  Location: [facility];  Service: Urology;  Laterality: Left;  . HOLMIUM LASER APPLICATION Bilateral 07/15/2017   Procedure: HOLMIUM LASER APPLICATION;  Surgeon: Sebastian Ache, MD;  Location: [facility];  Service: Urology;  Laterality: Bilateral;  . HOLMIUM LASER APPLICATION Left 07/29/2017   Procedure: HOLMIUM LASER APPLICATION;  Surgeon: Sebastian Ache, MD;  Location: [facility];  Service: Urology;  Laterality: Left;  . TENDON REPAIR Left 04/21/2017   Procedure: LEFT THUMB REPAIR EXTENSOR  TENDON;  Surgeon: Betha Loa, MD;  Location: [facility];  Service: Orthopedics;  Laterality: Left;  . TUBAL LIGATION Bilateral 2013   [place]   "had procedure to not have children any more"     OB History   None      Home Medications    Prior to Admission medications   Medication Sig Start Date End Date Taking? Authorizing Provider  cephALEXin (KEFLEX) 500 MG capsule Take 1 capsule (500 mg total) by mouth 2 (two) times daily. X 3 days to prevent infection with stent in place. Please label in Spanish. 07/29/17   Manny,  Normand Sloopheodore, MD  ketorolac (TORADOL) 10 MG tablet Take 1 tablet (10 mg total) by mouth every 6 (six) hours as needed for moderate pain. And stent discomfort post-operatively. Pt has tolerated prior. Please label in Spanish. 07/29/17   Sebastian AcheManny, Theodore, MD  oxyCODONE-acetaminophen (ROXICET) 5-325 MG tablet Take 1-2 tablets by mouth every 6 (six) hours as needed for severe pain. Post-operatively. Please label in Spanish. 07/29/17 07/29/18  Sebastian AcheManny, Theodore, MD  senna-docusate  (SENOKOT-S) 8.6-50 MG tablet Take 1 tablet by mouth 2 (two) times daily. While taking strongest pain meds to prevent constipation. Please label in Spanish. 07/15/17   Sebastian AcheManny, Theodore, MD    Family History No family history on file.  Social History Social History   Tobacco Use  . Smoking status: Never Smoker  . Smokeless tobacco: Never Used  Substance Use Topics  . Alcohol use: No  . Drug use: No     Allergies   Patient has no known allergies.   Review of Systems Review of Systems  Constitutional: Negative for fever and unexpected weight change.  Gastrointestinal: Negative for constipation.       Negative for fecal incontinence.   Genitourinary: Positive for menstrual problem. Negative for dysuria, flank pain, hematuria, pelvic pain, vaginal bleeding and vaginal discharge.       Negative for urinary incontinence or retention.  Musculoskeletal: Positive for back pain and myalgias.  Neurological: Negative for weakness and numbness.       Denies saddle paresthesias.     Physical Exam Updated Vital Signs BP 118/82 (BP Location: Right Arm)   Pulse 89   Temp 97.8 F (36.6 C) (Oral)   Resp 16   Wt 86.2 kg (190 lb)   SpO2 100%   BMI 33.66 kg/m   Physical Exam  Constitutional: She appears well-developed and well-nourished.  HENT:  Head: Normocephalic and atraumatic.  Eyes: Conjunctivae are normal.  Neck: Normal range of motion. Neck supple.  Pulmonary/Chest: Effort normal.  Abdominal: Soft. There is no tenderness. There is no CVA tenderness.  Musculoskeletal:       Right hip: Normal.       Left hip: Normal.       Cervical back: She exhibits normal range of motion, no tenderness and no bony tenderness.       Thoracic back: She exhibits normal range of motion, no tenderness and no bony tenderness.       Lumbar back: She exhibits tenderness. She exhibits normal range of motion and no bony tenderness.  No step-off noted with palpation of spine.   Neurological: She is  alert. She has normal strength and normal reflexes. No sensory deficit.  5/5 strength in entire lower extremities bilaterally. No sensation deficit.   Patient stands up and sits down very slowly but is able to ambulate without assistance.  Skin: Skin is warm and dry. No rash noted.  Psychiatric: She has a normal mood and affect.  Nursing note and vitals reviewed.    ED Treatments / Results  Labs (all labs ordered are listed, but only abnormal results are displayed) Labs Reviewed - No data to display  EKG None  Radiology No results found.  Procedures Procedures (including critical care time)  Medications Ordered in ED Medications  ketorolac (TORADOL) injection 30 mg (has no administration in time range)  predniSONE (DELTASONE) tablet 40 mg (has no administration in time range)     Initial Impression / Assessment and Plan / ED Course  I have reviewed the triage vital signs and  the nursing notes.  Pertinent labs & imaging results that were available during my care of the patient were reviewed by me and considered in my medical decision making (see chart for details).     8:09 PM Patient seen and examined. Interpreter used.   Vital signs reviewed and are as follows: Vitals:   05/24/18 1850  BP: 118/82  Pulse: 89  Resp: 16  Temp: 97.8 F (36.6 C)  SpO2: 100%    No red flag s/s of low back pain. Patient was counseled on back pain precautions and told to do activity as tolerated but do not lift, push, or pull heavy objects more than 10 pounds for the next week.  Patient counseled to use ice or heat on back for no longer than 15 minutes every hour.   Patient counseled on proper use of muscle relaxant medication.  They were told not to drink alcohol, drive any vehicle, or do any dangerous activities while taking this medication.  Patient verbalized understanding.  Patient urged to follow-up with PCP if pain does not improve with treatment and rest or if pain becomes  recurrent. Urged to return with worsening severe pain, loss of bowel or bladder control, trouble walking.   The patient verbalizes understanding and agrees with the plan.   Final Clinical Impressions(s) / ED Diagnoses   Final diagnoses:  Lumbar radiculopathy   Patient with back pain with radicular features. No neurological deficits. Patient is ambulatory. No warning symptoms of back pain including: fecal incontinence, urinary retention or overflow incontinence, night sweats, waking from sleep with back pain, unexplained fevers or weight loss, h/o cancer, IVDU, recent trauma. No concern for cauda equina, epidural abscess, or other serious cause of back pain. Conservative measures such as rest, ice/heat and pain medicine indicated with PCP follow-up if no improvement with conservative management.    ED Discharge Orders        Ordered    predniSONE (DELTASONE) 20 MG tablet     05/24/18 2009    methocarbamol (ROBAXIN) 500 MG tablet  4 times daily     05/24/18 2009       Renne Crigler, Cordelia Poche 05/24/18 2011    Loren Racer, MD 05/27/18 [phone]

## 2018-05-24 NOTE — ED Notes (Signed)
Pt verbalized understanding discharge instructions and denies any further needs or questions at this time. VS stable, ambulatory and steady gait.  See EDP assessment / note.  Translator used,

## 2018-05-24 NOTE — Discharge Instructions (Signed)
Please read and follow all provided instructions.  Your diagnoses today include:  1. Lumbar radiculopathy    Tests performed today include:  Vital signs - see below for your results today  Medications prescribed:   Prednisone - steroid medicine   It is best to take this medication in the morning to prevent sleeping problems. If you are diabetic, monitor your blood sugar closely and stop taking Prednisone if blood sugar is over 300. Take with food to prevent stomach upset.    Robaxin (methocarbamol) - muscle relaxer medication  DO NOT drive or perform any activities that require you to be awake and alert because this medicine can make you drowsy.   Take any prescribed medications only as directed.  Home care instructions:   Follow any educational materials contained in this packet  Please rest, use ice or heat on your back for the next several days  Do not lift, push, pull anything more than 10 pounds for the next week  Follow-up instructions: Please follow-up with your primary care provider in the next 1 week for further evaluation of your symptoms.   Return instructions:  SEEK IMMEDIATE MEDICAL ATTENTION IF YOU HAVE:  New numbness, tingling, weakness, or problem with the use of your arms or legs  Severe back pain not relieved with medications  Loss control of your bowels or bladder  Increasing pain in any areas of the body (such as chest or abdominal pain)  Shortness of breath, dizziness, or fainting.   Worsening nausea (feeling sick to your stomach), vomiting, fever, or sweats  Any other emergent concerns regarding your health   Additional Information:  Your vital signs today were: BP 118/82 (BP Location: Right Arm)    Pulse 89    Temp 97.8 F (36.6 C) (Oral)    Resp 16    Wt 86.2 kg (190 lb)    SpO2 100%    BMI 33.66 kg/m  If your blood pressure (BP) was elevated above 135/85 this visit, please have this repeated by your doctor within one  month. --------------

## 2018-07-19 ENCOUNTER — Encounter (HOSPITAL_COMMUNITY): Payer: Self-pay | Admitting: Emergency Medicine

## 2018-07-19 ENCOUNTER — Emergency Department (HOSPITAL_COMMUNITY)
Admission: EM | Admit: 2018-07-19 | Discharge: 2018-07-19 | Disposition: A | Discharge status: Home / Self Care | Payer: Self-pay | Attending: Emergency Medicine | Admitting: Emergency Medicine

## 2018-07-19 ENCOUNTER — Other Ambulatory Visit: Payer: Self-pay

## 2018-07-19 DIAGNOSIS — Z79899 Other long term (current) drug therapy: Secondary | ICD-10-CM | POA: Insufficient documentation

## 2018-07-19 DIAGNOSIS — J069 Acute upper respiratory infection, unspecified: Secondary | ICD-10-CM | POA: Insufficient documentation

## 2018-07-19 DIAGNOSIS — B9789 Other viral agents as the cause of diseases classified elsewhere: Secondary | ICD-10-CM

## 2018-07-19 MED ORDER — BENZONATATE 100 MG PO CAPS: 100.0000 mg | ORAL_CAPSULE | Freq: Three times a day (TID) | ORAL | 0 refills | Status: AC

## 2018-07-19 NOTE — ED Triage Notes (Signed)
When she coughs it makes her throw up

## 2018-07-19 NOTE — ED Provider Notes (Signed)
[facility] EMERGENCY DEPARTMENT Provider Note   CSN: [id number] Arrival date & time: 07/19/18  4098     History   Chief Complaint Chief Complaint  Patient presents with  . Cough    HPI Anjulie Clydene Pugh Pagan is a 32 y.o. female.  32 year old female with no past medical history presents to the ED with a chief complaint of cough x2 days.  Patient states that she has had a wet cough past 2 days.  Works in [employer] and is not allowed to work cough.  She reports this cough as productive.  Also reports some posttussive emesis.  Patient also reports a sore throat worse with swallowing.  Patient has tried using her kids albuterol but states no relief in symptoms.  She denies any fever, chest pain, shortness of breath,or abdominal pain.     Past Medical History:  Diagnosis Date  . Dysuria   . Flank pain   . History of kidney stones   . Left ureteral calculus    residual stone s/p  1st stage laser lithotripsy 07-15-2017  . Retained ureteral stent    right side  . Wears glasses     There are no active problems to display for this patient.   Past Surgical History:  Procedure Laterality Date  . CYSTOSCOPY W/ URETERAL STENT REMOVAL Bilateral 07/29/2017   Procedure: CYSTOSCOPY WITH STENT REMOVAL;  Surgeon: Sebastian Ache, MD;  Location: [facility];  Service: Urology;  Laterality: Bilateral;  . CYSTOSCOPY WITH RETROGRADE PYELOGRAM, URETEROSCOPY AND STENT PLACEMENT Bilateral 07/15/2017   Procedure: CYSTOSCOPY WITH bilateral RETROGRADE PYELOGRAM, right URETEROSCOPY, left diagnostic ureteroscopy AND STENT PLACEMENT;  Surgeon: Sebastian Ache, MD;  Location: [facility];  Service: Urology;  Laterality: Bilateral;  . CYSTOSCOPY WITH RETROGRADE PYELOGRAM, URETEROSCOPY AND STENT PLACEMENT Left 07/29/2017   Procedure: CYSTOSCOPY WITH RETROGRADE PYELOGRAM, URETEROSCOPY AND STENT REPLACEMENT LEFT URETER;  Surgeon: Sebastian Ache, MD;   Location: [facility];  Service: Urology;  Laterality: Left;  . HOLMIUM LASER APPLICATION Bilateral 07/15/2017   Procedure: HOLMIUM LASER APPLICATION;  Surgeon: Sebastian Ache, MD;  Location: [facility];  Service: Urology;  Laterality: Bilateral;  . HOLMIUM LASER APPLICATION Left 07/29/2017   Procedure: HOLMIUM LASER APPLICATION;  Surgeon: Sebastian Ache, MD;  Location: [facility];  Service: Urology;  Laterality: Left;  . TENDON REPAIR Left 04/21/2017   Procedure: LEFT THUMB REPAIR EXTENSOR  TENDON;  Surgeon: Betha Loa, MD;  Location: [facility];  Service: Orthopedics;  Laterality: Left;  . TUBAL LIGATION Bilateral 2013   [place]   "had procedure to not have children any more"     OB History   None      Home Medications    Prior to Admission medications   Medication Sig Start Date End Date Taking? Authorizing Provider  benzonatate (TESSALON) 100 MG capsule Take 1 capsule (100 mg total) by mouth every 8 (eight) hours for 7 days. 07/19/18 07/26/18  Claude Manges, PA-C  cephALEXin (KEFLEX) 500 MG capsule Take 1 capsule (500 mg total) by mouth 2 (two) times daily. X 3 days to prevent infection with stent in place. Please label in Spanish. 07/29/17   Sebastian Ache, MD  ketorolac (TORADOL) 10 MG tablet Take 1 tablet (10 mg total) by mouth every 6 (six) hours as needed for moderate pain. And stent discomfort post-operatively. Pt has tolerated prior. Please label in Spanish. 07/29/17   Sebastian Ache, MD  methocarbamol (  ROBAXIN) 500 MG tablet Take 2 tablets (1,000 mg total) by mouth 4 (four) times daily. 05/24/18   Renne CriglerGeiple, Joshua, PA-C  oxyCODONE-acetaminophen (ROXICET) 5-325 MG tablet Take 1-2 tablets by mouth every 6 (six) hours as needed for severe pain. Post-operatively. Please label in Spanish. 07/29/17 07/29/18  Sebastian AcheManny, Theodore, MD  predniSONE (DELTASONE) 20 MG tablet 3 Tabs PO Days 1-3, then 2 tabs PO Days 4-6, then 1 tab PO Day  7-9, then Half Tab PO Day 10-12 05/24/18   Renne CriglerGeiple, Joshua, PA-C  senna-docusate (SENOKOT-S) 8.6-50 MG tablet Take 1 tablet by mouth 2 (two) times daily. While taking strongest pain meds to prevent constipation. Please label in Spanish. 07/15/17   Sebastian AcheManny, Theodore, MD    Family History No family history on file.  Social History Social History   Tobacco Use  . Smoking status: Never Smoker  . Smokeless tobacco: Never Used  Substance Use Topics  . Alcohol use: No  . Drug use: No     Allergies   Patient has no known allergies.   Review of Systems Review of Systems  Constitutional: Negative for chills and fever.  HENT: Positive for sore throat. Negative for ear pain, postnasal drip, sinus pressure and sinus pain.   Eyes: Negative for pain and visual disturbance.  Respiratory: Positive for cough. Negative for shortness of breath.   Cardiovascular: Negative for chest pain and palpitations.  Gastrointestinal: Negative for abdominal pain and vomiting.  Genitourinary: Negative for dysuria and hematuria.  Musculoskeletal: Negative for arthralgias and back pain.  Skin: Negative for color change and rash.  Neurological: Negative for seizures and syncope.  All other systems reviewed and are negative.    Physical Exam Updated Vital Signs BP 126/85 (BP Location: Right Arm)   Pulse 95   Temp 98.9 F (37.2 C) (Oral)   Resp 16   LMP 07/11/2018   SpO2 95%   Physical Exam  Constitutional: She is oriented to person, place, and time. She appears well-developed and well-nourished. No distress.  HENT:  Head: Normocephalic and atraumatic.  Mouth/Throat: Uvula is midline, oropharynx is clear and moist and mucous membranes are normal. No oropharyngeal exudate, posterior oropharyngeal edema, posterior oropharyngeal erythema or tonsillar abscesses.  No exudate, edema, erythema on oropharynx.  Eyes: Pupils are equal, round, and reactive to light.  Neck: Normal range of motion.  Cardiovascular:  Regular rhythm and normal heart sounds.  Pulmonary/Chest: Effort normal and breath sounds normal. No respiratory distress. She has no decreased breath sounds. She has no wheezes.  Abdominal: Soft. Bowel sounds are normal. She exhibits no distension. There is no tenderness.  Musculoskeletal: She exhibits no tenderness or deformity.       Right lower leg: She exhibits no edema.       Left lower leg: She exhibits no edema.  Neurological: She is alert and oriented to person, place, and time.  Skin: Skin is warm and dry. Capillary refill takes less than 2 seconds.  Psychiatric: She has a normal mood and affect.  Nursing note and vitals reviewed.    ED Treatments / Results  Labs (all labs ordered are listed, but only abnormal results are displayed) Labs Reviewed - No data to display  EKG None  Radiology No results found.  Procedures Procedures (including critical care time)  Medications Ordered in ED Medications - No data to display   Initial Impression / Assessment and Plan / ED Course  I have reviewed the triage vital signs and the nursing notes.  Pertinent labs &  imaging results that were available during my care of the patient were reviewed by me and considered in my medical decision making (see chart for details).     32 year old female presents with cough x2 days.  Reports some posttussive emesis.  Upon exam patient does not exhibit wheezing, there is lung sounds in all lung fields.  Patient states she cannot turn to work as she works in [employer] with a cough.  We will provide her with some Tessalon Pearls.  Patient denies any lymphadenopathy, I believe this is less likely to be strep as she does not have any edema, erythema, exudates.  Patient symptoms have been going on for 2 days, there is no fever present I believe this is less likely to be pneumonia.  Return precautions have been provided.  Final Clinical Impressions(s) / ED Diagnoses   Final diagnoses:  Viral  URI with cough    ED Discharge Orders        Ordered    benzonatate (TESSALON) 100 MG capsule  Every 8 hours     07/19/18 0951       Claude Manges, PA-C 07/19/18 1610    Donnetta Hutching, MD 07/21/18 1108

## 2018-07-19 NOTE — ED Triage Notes (Signed)
Translator/pt stated, Ive had a cough since yesterday and my throat itches after I cough

## 2018-07-19 NOTE — Discharge Instructions (Signed)
Le he recetado medicina para la tos. Tomela para parar la tos. En la noche puede tomar (Delsym) para parar la tos. Si es que sus simptomas empeoran regrese para Theatre managerre evaluarla. Si es que tiene: Tose y escupe sangre. Tiene dificultad para respirar. El Hershey Companycorazn le late Nahuntamuy rpido. Regrese a Radio broadcast assistantla sala de emergencia.

## 2018-11-11 ENCOUNTER — Encounter (HOSPITAL_COMMUNITY): Payer: Self-pay | Admitting: Emergency Medicine

## 2018-11-11 ENCOUNTER — Other Ambulatory Visit: Payer: Self-pay

## 2018-11-11 ENCOUNTER — Emergency Department (HOSPITAL_COMMUNITY)
Admission: EM | Admit: 2018-11-11 | Discharge: 2018-11-11 | Disposition: A | Discharge status: Home / Self Care | Payer: Self-pay | Attending: Emergency Medicine | Admitting: Emergency Medicine

## 2018-11-11 ENCOUNTER — Emergency Department (HOSPITAL_COMMUNITY): Payer: Self-pay

## 2018-11-11 DIAGNOSIS — Z79899 Other long term (current) drug therapy: Secondary | ICD-10-CM | POA: Insufficient documentation

## 2018-11-11 DIAGNOSIS — R102 Pelvic and perineal pain: Secondary | ICD-10-CM

## 2018-11-11 DIAGNOSIS — N390 Urinary tract infection, site not specified: Secondary | ICD-10-CM | POA: Insufficient documentation

## 2018-11-11 LAB — URINALYSIS, ROUTINE W REFLEX MICROSCOPIC
Bilirubin Urine: NEGATIVE
GLUCOSE, UA: NEGATIVE mg/dL
Ketones, ur: NEGATIVE mg/dL
Nitrite: NEGATIVE
PROTEIN: NEGATIVE mg/dL
Specific Gravity, Urine: 1.018 (ref 1.005–1.030)
WBC, UA: >50 WBC/hpf — ABNORMAL HIGH (ref 0–5)
pH: 5 (ref 5.0–8.0)

## 2018-11-11 LAB — WET PREP, GENITAL
CLUE CELLS WET PREP: NONE SEEN
Sperm: NONE SEEN
TRICH WET PREP: NONE SEEN
YEAST WET PREP: NONE SEEN

## 2018-11-11 LAB — POC URINE PREG, ED: Preg Test, Ur: NEGATIVE

## 2018-11-11 MED ORDER — IBUPROFEN 600 MG PO TABS: 600.0000 mg | ORAL_TABLET | Freq: Four times a day (QID) | ORAL | 0 refills | Status: DC | PRN

## 2018-11-11 MED ORDER — NITROFURANTOIN MONOHYD MACRO 100 MG PO CAPS: 100.0000 mg | ORAL_CAPSULE | Freq: Two times a day (BID) | ORAL | 0 refills | Status: DC

## 2018-11-11 NOTE — ED Notes (Signed)
Pt. Requested not to be checked through her vaginal area. Pt. Stated "it is better to check up top and not go down there"

## 2018-11-11 NOTE — ED Provider Notes (Signed)
[facility]ENC[facility] Provider Note   CSN: [id number] Arrival date & time: 11/11/18  [id number]     History   Chief Complaint No chief complaint on file.   HPI Jill Riley is a 32 y.o. female.  HPI Jill Riley is a 32 y.o. female presents to ER with complaint of right sided pain. Pt states she had her menstrual cycle about 2 weeks ago. States it was heavier than usual. States after she finished cycle, she had some unusual for her vaginal discharge which she states was brown and thick. States about 8 days ago, she developed right sided abdominal pain. Pain is intermittent. States worse with movement and when having intercourse. States vaginal discharge has stopped. No urinary symptoms. No fever, chills. No n/v. No changes in bowels. No medications taken prior to coming in. Pain radiates into right flank.   Past Medical History:  Diagnosis Date  . Dysuria   . Flank pain   . History of kidney stones   . Left ureteral calculus    residual stone s/p  1st stage laser lithotripsy 07-15-2017  . Retained ureteral stent    right side  . Wears glasses     There are no active problems to display for this patient.   Past Surgical History:  Procedure Laterality Date  . CYSTOSCOPY W/ URETERAL STENT REMOVAL Bilateral 07/29/2017   Procedure: CYSTOSCOPY WITH STENT REMOVAL;  Surgeon: Sebastian AcheManny, Theodore, MD;  [facility] [place];  Service: Urology;  Laterality: Bilateral;  . CYSTOSCOPY WITH RETROGRADE PYELOGRAM, URETEROSCOPY AND STENT PLACEMENT Bilateral 07/15/2017   Procedure: CYSTOSCOPY WITH bilateral RETROGRADE PYELOGRAM, right URETEROSCOPY, left diagnostic ureteroscopy AND STENT PLACEMENT;  Surgeon: Sebastian AcheManny, Theodore, MD;  [facility] [place];  Service: Urology;  Laterality: Bilateral;  . CYSTOSCOPY WITH RETROGRADE PYELOGRAM, URETEROSCOPY AND STENT PLACEMENT Left 07/29/2017   Procedure: CYSTOSCOPY WITH  RETROGRADE PYELOGRAM, URETEROSCOPY AND STENT REPLACEMENT LEFT URETER;  Surgeon: Sebastian AcheManny, Theodore, MD;  [facility] [place];  Service: Urology;  Laterality: Left;  . HOLMIUM LASER APPLICATION Bilateral 07/15/2017   Procedure: HOLMIUM LASER APPLICATION;  Surgeon: Sebastian AcheManny, Theodore, MD;  [facility] [place];  Service: Urology;  Laterality: Bilateral;  . HOLMIUM LASER APPLICATION Left 07/29/2017   Procedure: HOLMIUM LASER APPLICATION;  Surgeon: Sebastian AcheManny, Theodore, MD;  [facility] [place];  Service: Urology;  Laterality: Left;  . TENDON REPAIR Left 04/21/2017   Procedure: LEFT THUMB REPAIR EXTENSOR  TENDON;  Surgeon: Betha LoaKuzma, Kevin, MD;  Location: [facility];  Service: Orthopedics;  Laterality: Left;  . TUBAL LIGATION Bilateral 2013   [place]   "had procedure to not have children any more"     OB History   None      Home Medications    Prior to Admission medications   Medication Sig Start Date End Date Taking? Authorizing Provider  cephALEXin (KEFLEX) 500 MG capsule Take 1 capsule (500 mg total) by mouth 2 (two) times daily. X 3 days to prevent infection with stent in place. Please label in Spanish. 07/29/17   Sebastian AcheManny, Theodore, MD  ketorolac (TORADOL) 10 MG tablet Take 1 tablet (10 mg total) by mouth every 6 (six) hours as needed for moderate pain. And stent discomfort post-operatively. Pt has tolerated prior. Please label in Spanish. 07/29/17   Sebastian AcheManny, Theodore, MD  methocarbamol (ROBAXIN) 500 MG tablet Take 2 tablets (1,000 mg total) by mouth 4 (four) times daily. 05/24/18   Renne CriglerGeiple, Joshua,  PA-C  predniSONE (DELTASONE) 20 MG tablet 3 Tabs PO Days 1-3, then 2 tabs PO Days 4-6, then 1 tab PO Day 7-9, then Half Tab PO Day 10-12 05/24/18   Renne Crigler, PA-C  senna-docusate (SENOKOT-S) 8.6-50 MG tablet Take 1 tablet by mouth 2 (two) times daily. While taking strongest pain meds to prevent constipation. Please label in Spanish. 07/15/17   Sebastian Ache, MD    Family History No family history on file.  Social History Social History   Tobacco Use  . Smoking status: Never Smoker  . Smokeless tobacco: Never Used  Substance Use Topics  . Alcohol use: No  . Drug use: No     Allergies   Patient has no known allergies.   Review of Systems Review of Systems  Constitutional: Negative for chills and fever.  Gastrointestinal: Positive for abdominal pain. Negative for nausea and vomiting.  Genitourinary: Positive for pelvic pain, vaginal discharge and vaginal pain. Negative for dysuria, frequency and hematuria.  Musculoskeletal: Negative for arthralgias and myalgias.  Skin: Negative for rash.  Neurological: Negative for headaches.  All other systems reviewed and are negative.    Physical Exam Updated Vital Signs BP 115/80 (BP Location: Right Arm)   Pulse 85   Temp 98.2 F (36.8 C) (Oral)   Resp 17   SpO2 100%   Physical Exam  Constitutional: She appears well-developed and well-nourished. No distress.  HENT:  Head: Normocephalic.  Eyes: Conjunctivae are normal.  Neck: Neck supple.  Cardiovascular: Normal rate, regular rhythm and normal heart sounds.  Pulmonary/Chest: Effort normal and breath sounds normal. No respiratory distress. She has no wheezes. She has no rales.  Abdominal: Soft. Bowel sounds are normal. She exhibits no distension. There is tenderness. There is no rebound and no guarding.  RLQ tenderness  Genitourinary:  Genitourinary Comments: Normal external genitalia. Normal vaginal canal. Small thin white discharge. Cervix is normal, closed. No CMT. No uterine tenderness. Right adnexal tenderness present. No masses palpated.    Musculoskeletal: She exhibits no edema.  Neurological: She is alert.  Skin: Skin is warm and dry.  Psychiatric: She has a normal mood and affect. Her behavior is normal.  Nursing note and vitals reviewed.    ED Treatments / Results  Labs (all labs ordered are listed, but  only abnormal results are displayed) Labs Reviewed  WET PREP, GENITAL - Abnormal; Notable for the following components:      Result Value   WBC, Wet Prep HPF POC MANY (*)    All other components within normal limits  URINALYSIS, ROUTINE W REFLEX MICROSCOPIC - Abnormal; Notable for the following components:   APPearance CLOUDY (*)    Hgb urine dipstick MODERATE (*)    Leukocytes, UA LARGE (*)    WBC, UA >50 (*)    Bacteria, UA RARE (*)    All other components within normal limits  URINE CULTURE  POC URINE PREG, ED  GC/CHLAMYDIA PROBE AMP ([place]) NOT AT [facility]    EKG None  Radiology US Pelvic Doppler (torsion R/o Or Mass Arterial Flow)  Result Date: 11/11/2018 CLINICAL DATA:  32 year old female with right pelvic pain for several days. LMP 10/30/2018. EXAM: TRANSABDOMINAL AND TRANSVAGINAL ULTRASOUND OF PELVIS DOPPLER ULTRASOUND OF OVARIES TECHNIQUE: Both transabdominal and transvaginal ultrasound examinations of the pelvis were performed. Transabdominal technique was performed for global imaging of the pelvis including uterus, ovaries, adnexal regions, and pelvic cul-de-sac. It was necessary to proceed with endovaginal exam following the transabdominal exam to visualize  the ovaries. Color and duplex Doppler ultrasound was utilized to evaluate blood flow to the ovaries. COMPARISON:  CT Abdomen and Pelvis 06/27/2017. FINDINGS: Uterus Measurements: 10.0 x 4.8 x 7.2 centimeters = volume: 183 mL. No fibroids or other mass visualized. Endometrium Thickness: 10 millimeters.  No focal abnormality visualized. Right ovary Measurements: 4.7 x 3.2 x 2.5 centimeters = volume: 20 ML. Multiple small follicles (image 57) many arranged around the periphery. Left ovary Measurements: 3.8 x 2.3 x 3.0 centimeters = volume: 14 mL. Multiple small follicles peripherally located similar to the right ovary (image 66). Pulsed Doppler evaluation of both ovaries demonstrates normal low-resistance arterial and venous  waveforms. Other findings No free fluid. IMPRESSION: 1. Negative for ovarian torsion. 2. Multiple small peripherally located follicles in both ovaries. Consider polycystic ovarian syndrome. 3. Otherwise normal for age ultrasound appearance of the pelvis. Electronically Signed   By: Odessa Fleming M.D.   On: 11/11/2018 10:50   US Pelvic Complete With Transvaginal  Result Date: 11/11/2018 CLINICAL DATA:  32 year old female with right pelvic pain for several days. LMP 10/30/2018. EXAM: TRANSABDOMINAL AND TRANSVAGINAL ULTRASOUND OF PELVIS DOPPLER ULTRASOUND OF OVARIES TECHNIQUE: Both transabdominal and transvaginal ultrasound examinations of the pelvis were performed. Transabdominal technique was performed for global imaging of the pelvis including uterus, ovaries, adnexal regions, and pelvic cul-de-sac. It was necessary to proceed with endovaginal exam following the transabdominal exam to visualize the ovaries. Color and duplex Doppler ultrasound was utilized to evaluate blood flow to the ovaries. COMPARISON:  CT Abdomen and Pelvis 06/27/2017. FINDINGS: Uterus Measurements: 10.0 x 4.8 x 7.2 centimeters = volume: 183 mL. No fibroids or other mass visualized. Endometrium Thickness: 10 millimeters.  No focal abnormality visualized. Right ovary Measurements: 4.7 x 3.2 x 2.5 centimeters = volume: 20 ML. Multiple small follicles (image 57) many arranged around the periphery. Left ovary Measurements: 3.8 x 2.3 x 3.0 centimeters = volume: 14 mL. Multiple small follicles peripherally located similar to the right ovary (image 66). Pulsed Doppler evaluation of both ovaries demonstrates normal low-resistance arterial and venous waveforms. Other findings No free fluid. IMPRESSION: 1. Negative for ovarian torsion. 2. Multiple small peripherally located follicles in both ovaries. Consider polycystic ovarian syndrome. 3. Otherwise normal for age ultrasound appearance of the pelvis. Electronically Signed   By: Odessa Fleming M.D.   On:  11/11/2018 10:50    Procedures Procedures (including critical care time)  Medications Ordered in ED Medications - No data to display   Initial Impression / Assessment and Plan / ED Course  I have reviewed the triage vital signs and the nursing notes.  Pertinent labs & imaging results that were available during my care of the patient were reviewed by me and considered in my medical decision making (see chart for details).     Pt with right lower quadrant pain for 9 days. Pain comes and goes but more severe today. Doubt appendicitis, since pain for greater than a week and pt complaining of vaginal discharge and dyspareunia. Pelvic exam with right adnexal tenderness. Will get [place] ro torion/ovarian cyst.   11:06 AM [place] negative. Urine showing UTI, however contaminated, will send cultures.  I reassessed patient.  She continues to have pain in the right lower quadrant.  I am concerned about possible appendicitis, however again it is less likely since her pain has been there for 9 days.  Also has negative psoas and obturator signs.  No anorexia.  I will start her antibiotics for UTI.  Ibuprofen for pain.  We discussed at length that if her symptoms are worsening or not improving with this treatment, she needs to come back for further evaluation and CT scan.  Patient agreed.  I actually offered her CT scan today, but patient wanted to try antibiotics first and stated that she will come back if is not improving.  Patient is nontoxic.  Normal vital signs.  No acute abdomen on examination.  She stable for discharge home with strict return precautions.  Vitals:   11/11/18 0915 11/11/18 0930  BP: 117/80 128/72  Pulse: 72 72  Resp:    Temp:    SpO2: 100% 100%     Final Clinical Impressions(s) / ED Diagnoses   Final diagnoses:  Adnexal pain  Lower urinary tract infectious disease    ED Discharge Orders         Ordered    nitrofurantoin, macrocrystal-monohydrate, (MACROBID) 100 MG capsule  2  times daily     11/11/18 1108    ibuprofen (ADVIL,MOTRIN) 600 MG tablet  Every 6 hours PRN     11/11/18 1108           Jaynie Crumble, PA-C 11/11/18 1110    Mesner, Barbara Cower, MD 11/12/18 1232

## 2018-11-11 NOTE — ED Notes (Signed)
Pt stable, ambulatory, and verbalizes understanding of d/c instructions.  

## 2018-11-11 NOTE — ED Notes (Signed)
Culture was sent with urine to lab

## 2018-11-11 NOTE — ED Triage Notes (Signed)
Pt here with c/o left side flank pain along with nausea.  Pt denies any vomiting.  Pt also endorses irregular menstrual cycle.

## 2018-11-11 NOTE — ED Notes (Signed)
Patient transported to US 

## 2018-11-11 NOTE — Discharge Instructions (Addendum)
Take ibuprofen for pain. Macrobid for infection. Return if you have worsening pain or if pain not improving for further imaging.

## 2018-11-12 LAB — GC/CHLAMYDIA PROBE AMP (~~LOC~~) NOT AT ARMC
Chlamydia: NEGATIVE
NEISSERIA GONORRHEA: NEGATIVE

## 2018-11-12 LAB — URINE CULTURE: Culture: NO GROWTH

## 2018-12-12 ENCOUNTER — Other Ambulatory Visit: Payer: Self-pay

## 2018-12-12 ENCOUNTER — Emergency Department (HOSPITAL_COMMUNITY)
Admission: EM | Admit: 2018-12-12 | Discharge: 2018-12-12 | Disposition: A | Discharge status: Home / Self Care | Payer: Self-pay | Attending: Emergency Medicine | Admitting: Emergency Medicine

## 2018-12-12 DIAGNOSIS — J020 Streptococcal pharyngitis: Secondary | ICD-10-CM | POA: Insufficient documentation

## 2018-12-12 DIAGNOSIS — Z79899 Other long term (current) drug therapy: Secondary | ICD-10-CM | POA: Insufficient documentation

## 2018-12-12 LAB — GROUP A STREP BY PCR: GROUP A STREP BY PCR: DETECTED — AB

## 2018-12-12 MED ORDER — BENZONATATE 100 MG PO CAPS: 100.0000 mg | ORAL_CAPSULE | Freq: Three times a day (TID) | ORAL | 0 refills | Status: DC

## 2018-12-12 MED ORDER — DEXAMETHASONE 1 MG/ML PO CONC
10.0000 mg | Freq: Once | ORAL | Status: AC
  Administered 2018-12-12: 10 mg via ORAL
  Filled 2018-12-12: qty 10

## 2018-12-12 MED ORDER — DEXAMETHASONE SODIUM PHOSPHATE 10 MG/ML IJ SOLN
INTRAMUSCULAR | Status: AC
  Filled 2018-12-12: qty 1

## 2018-12-12 MED ORDER — PENICILLIN G BENZATHINE 1200000 UNIT/2ML IM SUSP
1.2000 10*6.[IU] | Freq: Once | INTRAMUSCULAR | Status: AC
  Administered 2018-12-12: 1.2 10*6.[IU] via INTRAMUSCULAR
  Filled 2018-12-12: qty 2

## 2018-12-12 MED ORDER — IBUPROFEN 800 MG PO TABS: 800.0000 mg | ORAL_TABLET | Freq: Three times a day (TID) | ORAL | 0 refills | Status: DC | PRN

## 2018-12-12 NOTE — ED Triage Notes (Signed)
Pt c/o sore throat for 4 days. Also states that when her throat itches, she vomites. Swabbed in triage for strep. Also states that her face is breaking out with a rash.

## 2018-12-12 NOTE — Discharge Instructions (Signed)
Tessalon as needed for cough.  For sore throat, take ibuprofen every 6 hours as needed. Follow up with your doctor in 2-3 days if no improvement. Return to the ED sooner for worsening condition, inability to swallow, breathing difficulty, new concerns.

## 2018-12-12 NOTE — ED Provider Notes (Signed)
[facility] EMERGENCY DEPARTMENT Provider Note   CSN: [id number] Arrival date & time: 12/12/18  0549     History   Chief Complaint Chief Complaint  Patient presents with  . Sore Throat    HPI Jill Riley is a 32 y.o. female.  The history is provided by the patient and medical records. Language interpreter used: Family at bedside aiding in translation.  Sore Throat  Pertinent negatives include no shortness of breath.   Jill Riley is a 32 y.o. female who presents to the Emergency Department complaining of persistent sore throat for the last 4 days.  Associated with cough.  She has had 2 episodes where she coughed so much that she then had an episode of emesis.  Reports subjective fever, but has not checked her temperature.  She has tried cough lozenges with no improvement.  Denies sick contacts.  Denies any difficulty breathing.  Painful to swallow, but tolerating p.o. fine.   Past Medical History:  Diagnosis Date  . Dysuria   . Flank pain   . History of kidney stones   . Left ureteral calculus    residual stone s/p  1st stage laser lithotripsy 07-15-2017  . Retained ureteral stent    right side  . Wears glasses     There are no active problems to display for this patient.   Past Surgical History:  Procedure Laterality Date  . CYSTOSCOPY W/ URETERAL STENT REMOVAL Bilateral 07/29/2017   Procedure: CYSTOSCOPY WITH STENT REMOVAL;  Surgeon: Sebastian Ache, MD;  Location: [facility];  Service: Urology;  Laterality: Bilateral;  . CYSTOSCOPY WITH RETROGRADE PYELOGRAM, URETEROSCOPY AND STENT PLACEMENT Bilateral 07/15/2017   Procedure: CYSTOSCOPY WITH bilateral RETROGRADE PYELOGRAM, right URETEROSCOPY, left diagnostic ureteroscopy AND STENT PLACEMENT;  Surgeon: Sebastian Ache, MD;  Location: [facility];  Service: Urology;  Laterality: Bilateral;  . CYSTOSCOPY WITH RETROGRADE PYELOGRAM,  URETEROSCOPY AND STENT PLACEMENT Left 07/29/2017   Procedure: CYSTOSCOPY WITH RETROGRADE PYELOGRAM, URETEROSCOPY AND STENT REPLACEMENT LEFT URETER;  Surgeon: Sebastian Ache, MD;  [facility];  Service: Urology;  Laterality: Left;  . HOLMIUM LASER APPLICATION Bilateral 07/15/2017   Procedure: HOLMIUM LASER APPLICATION;  Surgeon: Sebastian Ache, MD;  Location: [facility];  Service: Urology;  Laterality: Bilateral;  . HOLMIUM LASER APPLICATION Left 07/29/2017   Procedure: HOLMIUM LASER APPLICATION;  Surgeon: Sebastian Ache, MD;  Location: [facility];  Service: Urology;  Laterality: Left;  . TENDON REPAIR Left 04/21/2017   Procedure: LEFT THUMB REPAIR EXTENSOR  TENDON;  Surgeon: Betha Loa, MD;  Location: [facility];  Service: Orthopedics;  Laterality: Left;  . TUBAL LIGATION Bilateral 2013   [place]   "had procedure to not have children any more"     OB History   No obstetric history on file.      Home Medications    Prior to Admission medications   Medication Sig Start Date End Date Taking? Authorizing Provider  benzonatate (TESSALON) 100 MG capsule Take 1 capsule (100 mg total) by mouth every 8 (eight) hours. 12/12/18   Ward, Chase Picket, PA-C  ibuprofen (ADVIL,MOTRIN) 800 MG tablet Take 1 tablet (800 mg total) by mouth every 8 (eight) hours as needed. 12/12/18   Ward, Chase Picket, PA-C  ketorolac (TORADOL) 10 MG tablet Take 1 tablet (10 mg total) by mouth every 6 (six) hours as needed for moderate pain. And stent discomfort post-operatively. Pt has tolerated prior.  Please label in Spanish. Patient not taking: Reported on 11/11/2018 07/29/17   Sebastian AcheManny, Theodore, MD  methocarbamol (ROBAXIN) 500 MG tablet Take 2 tablets (1,000 mg total) by mouth 4 (four) times daily. Patient not taking: Reported on 11/11/2018 05/24/18   Renne CriglerGeiple, Joshua, PA-C  nitrofurantoin, macrocrystal-monohydrate, (MACROBID) 100 MG capsule Take 1  capsule (100 mg total) by mouth 2 (two) times daily. 11/11/18   Kirichenko, Tatyana, PA-C  senna-docusate (SENOKOT-S) 8.6-50 MG tablet Take 1 tablet by mouth 2 (two) times daily. While taking strongest pain meds to prevent constipation. Please label in Spanish. Patient not taking: Reported on 11/11/2018 07/15/17   Sebastian AcheManny, Theodore, MD    Family History No family history on file.  Social History Social History   Tobacco Use  . Smoking status: Never Smoker  . Smokeless tobacco: Never Used  Substance Use Topics  . Alcohol use: No  . Drug use: No     Allergies   Patient has no known allergies.   Review of Systems Review of Systems  Constitutional: Positive for fever (Subjective).  HENT: Positive for sore throat. Negative for trouble swallowing.   Respiratory: Positive for cough. Negative for shortness of breath.   All other systems reviewed and are negative.    Physical Exam Updated Vital Signs BP (!) 126/96   Pulse 87   Temp 97.7 F (36.5 C) (Oral)   Resp 18   Ht 5\' 3"  (1.6 m)   Wt 90.7 kg   LMP 12/11/2018   SpO2 100%   BMI 35.43 kg/m   Physical Exam Vitals signs and nursing note reviewed.  Constitutional:      General: She is not in acute distress.    Appearance: She is well-developed.     Comments: Nontoxic-appearing.  HENT:     Head: Normocephalic and atraumatic.     Mouth/Throat:     Comments: Oropharynx with erythema and tonsillar hypertrophy, but no exudates. Neck:     Musculoskeletal: Neck supple.  Cardiovascular:     Rate and Rhythm: Normal rate and regular rhythm.     Heart sounds: Normal heart sounds. No murmur.  Pulmonary:     Effort: Pulmonary effort is normal. No respiratory distress.     Breath sounds: Normal breath sounds.     Comments: Lungs clear to auscultation bilaterally. Abdominal:     General: There is no distension.     Palpations: Abdomen is soft.     Tenderness: There is no abdominal tenderness.  Skin:    General: Skin is warm  and dry.  Neurological:     Mental Status: She is alert and oriented to person, place, and time.      ED Treatments / Results  Labs (all labs ordered are listed, but only abnormal results are displayed) Labs Reviewed  GROUP A STREP BY PCR - Abnormal; Notable for the following components:      Result Value   Group A Strep by PCR DETECTED (*)    All other components within normal limits    EKG None  Radiology No results found.  Procedures Procedures (including critical care time)  Medications Ordered in ED Medications  penicillin g benzathine (BICILLIN LA) 1200000 UNIT/2ML injection 1.2 Million Units (1.2 Million Units Intramuscular Given 12/12/18 0749)  dexamethasone (DECADRON) 1 MG/ML solution 10 mg (10 mg Oral Given 12/12/18 0749)     Initial Impression / Assessment and Plan / ED Course  I have reviewed the triage vital signs and the nursing notes.  Pertinent labs &  imaging results that were available during my care of the patient were reviewed by me and considered in my medical decision making (see chart for details).    Yassmine Clydene Pughicole Santiago Riley is a 32 y.o. female who presents to ED for sore throat. Strep +. Afebrile, tolerating PO. Given bicillin and decadron in ED. Reasons to return to ER discussed. All questions answered.  Final Clinical Impressions(s) / ED Diagnoses   Final diagnoses:  Strep throat    ED Discharge Orders         Ordered    benzonatate (TESSALON) 100 MG capsule  Every 8 hours     12/12/18 0743    ibuprofen (ADVIL,MOTRIN) 800 MG tablet  Every 8 hours PRN     12/12/18 0744           Ward, Chase PicketJaime Pilcher, PA-C 12/12/18 0800    Lorre NickAllen, Anthony, MD 12/13/18 [phone]

## 2018-12-27 ENCOUNTER — Emergency Department (HOSPITAL_COMMUNITY)
Admission: EM | Admit: 2018-12-27 | Discharge: 2018-12-27 | Disposition: A | Discharge status: Home / Self Care | Payer: Self-pay | Attending: Emergency Medicine | Admitting: Emergency Medicine

## 2018-12-27 ENCOUNTER — Encounter (HOSPITAL_COMMUNITY): Payer: Self-pay | Admitting: Emergency Medicine

## 2018-12-27 ENCOUNTER — Emergency Department (HOSPITAL_COMMUNITY): Payer: Self-pay

## 2018-12-27 ENCOUNTER — Other Ambulatory Visit: Payer: Self-pay

## 2018-12-27 DIAGNOSIS — F419 Anxiety disorder, unspecified: Secondary | ICD-10-CM | POA: Insufficient documentation

## 2018-12-27 DIAGNOSIS — Z79899 Other long term (current) drug therapy: Secondary | ICD-10-CM | POA: Insufficient documentation

## 2018-12-27 DIAGNOSIS — R079 Chest pain, unspecified: Secondary | ICD-10-CM | POA: Insufficient documentation

## 2018-12-27 LAB — BASIC METABOLIC PANEL
Anion gap: 11 (ref 5–15)
BUN: 13 mg/dL (ref 6–20)
CALCIUM: 9.6 mg/dL (ref 8.9–10.3)
CO2: 22 mmol/L (ref 22–32)
CREATININE: 0.61 mg/dL (ref 0.44–1.00)
Chloride: 105 mmol/L (ref 98–111)
GFR calc non Af Amer: >60 mL/min (ref >60)
GLUCOSE: 100 mg/dL — AB (ref 70–99)
Potassium: 4.3 mmol/L (ref 3.5–5.1)
Sodium: 138 mmol/L (ref 135–145)

## 2018-12-27 LAB — CBC WITH DIFFERENTIAL/PLATELET
Abs Immature Granulocytes: 0.03 10*3/uL (ref 0.00–0.07)
BASOS PCT: 1 %
Basophils Absolute: 0.1 10*3/uL (ref 0.0–0.1)
EOS ABS: 0.1 10*3/uL (ref 0.0–0.5)
Eosinophils Relative: 1 %
HEMATOCRIT: 43.8 % (ref 36.0–46.0)
HEMOGLOBIN: 14 g/dL (ref 12.0–15.0)
Immature Granulocytes: 0 %
Lymphocytes Relative: 39 %
Lymphs Abs: 4.3 10*3/uL — ABNORMAL HIGH (ref 0.7–4.0)
MCH: 30 pg (ref 26.0–34.0)
MCHC: 32 g/dL (ref 30.0–36.0)
MCV: 94 fL (ref 80.0–100.0)
Monocytes Absolute: 0.8 10*3/uL (ref 0.1–1.0)
Monocytes Relative: 7 %
NEUTROS PCT: 52 %
NRBC: 0 % (ref 0.0–0.2)
Neutro Abs: 5.6 10*3/uL (ref 1.7–7.7)
Platelets: 295 10*3/uL (ref 150–400)
RBC: 4.66 MIL/uL (ref 3.87–5.11)
RDW: 12 % (ref 11.5–15.5)
WBC: 10.8 10*3/uL — AB (ref 4.0–10.5)

## 2018-12-27 LAB — I-STAT TROPONIN, ED: TROPONIN I, POC: 0 ng/mL (ref 0.00–0.08)

## 2018-12-27 MED ORDER — IBUPROFEN 800 MG PO TABS: 800.0000 mg | ORAL_TABLET | Freq: Three times a day (TID) | ORAL | 0 refills | Status: DC | PRN

## 2018-12-27 NOTE — ED Provider Notes (Signed)
[facility] EMERGENCY DEPARTMENT Provider Note   CSN: [id number] Arrival date & time: 12/27/18  0040     History   Chief Complaint Chief Complaint  Patient presents with  . Chest Pain  . Tingling    HPI Jill Riley is a 33 y.o. female.  Patient presents to the emergency department with a chief complaint of left shoulder pain.  She states that the symptoms started while she was driving this evening.  She states that she is traveling from [place].  Reports that she had some tingling on the left side of her face.  She denies any pain that radiates into her arm or jaw.  She states that the pain is worsened with palpation.  She denies any injuries.  She states that it feels like her clavicle is swollen.  She has not taken any medications for her symptoms.  Denies any recent illnesses.  Denies any history of PE or DVT.  Denies any history of ACS.  The history is provided by the patient. No language interpreter was used.    Past Medical History:  Diagnosis Date  . Dysuria   . Flank pain   . History of kidney stones   . Left ureteral calculus    residual stone s/p  1st stage laser lithotripsy 07-15-2017  . Retained ureteral stent    right side  . Wears glasses     There are no active problems to display for this patient.   Past Surgical History:  Procedure Laterality Date  . CYSTOSCOPY W/ URETERAL STENT REMOVAL Bilateral 07/29/2017   Procedure: CYSTOSCOPY WITH STENT REMOVAL;  Surgeon: Sebastian Ache, MD;  Location: [facility];  Service: Urology;  Laterality: Bilateral;  . CYSTOSCOPY WITH RETROGRADE PYELOGRAM, URETEROSCOPY AND STENT PLACEMENT Bilateral 07/15/2017   Procedure: CYSTOSCOPY WITH bilateral RETROGRADE PYELOGRAM, right URETEROSCOPY, left diagnostic ureteroscopy AND STENT PLACEMENT;  Surgeon: Sebastian Ache, MD;  Location: [facility];  Service: Urology;  Laterality: Bilateral;  . CYSTOSCOPY WITH  RETROGRADE PYELOGRAM, URETEROSCOPY AND STENT PLACEMENT Left 07/29/2017   Procedure: CYSTOSCOPY WITH RETROGRADE PYELOGRAM, URETEROSCOPY AND STENT REPLACEMENT LEFT URETER;  Surgeon: Sebastian Ache, MD;  Location: [facility];  Service: Urology;  Laterality: Left;  . HOLMIUM LASER APPLICATION Bilateral 07/15/2017   Procedure: HOLMIUM LASER APPLICATION;  Surgeon: Sebastian Ache, MD;  Location: [facility];  Service: Urology;  Laterality: Bilateral;  . HOLMIUM LASER APPLICATION Left 07/29/2017   Procedure: HOLMIUM LASER APPLICATION;  Surgeon: Sebastian Ache, MD;  Location: [facility];  Service: Urology;  Laterality: Left;  . TENDON REPAIR Left 04/21/2017   Procedure: LEFT THUMB REPAIR EXTENSOR  TENDON;  Surgeon: Betha Loa, MD;  Location: [facility];  Service: Orthopedics;  Laterality: Left;  . TUBAL LIGATION Bilateral 2013   [place]   "had procedure to not have children any more"     OB History   No obstetric history on file.      Home Medications    Prior to Admission medications   Medication Sig Start Date End Date Taking? Authorizing Provider  benzonatate (TESSALON) 100 MG capsule Take 1 capsule (100 mg total) by mouth every 8 (eight) hours. 12/12/18   Ward, Chase Picket, PA-C  ibuprofen (ADVIL,MOTRIN) 800 MG tablet Take 1 tablet (800 mg total) by mouth every 8 (eight) hours as needed. 12/12/18   Ward, Chase Picket, PA-C  ketorolac (TORADOL) 10 MG tablet Take 1 tablet (10 mg total) by  mouth every 6 (six) hours as needed for moderate pain. And stent discomfort post-operatively. Pt has tolerated prior. Please label in Spanish. Patient not taking: Reported on 11/11/2018 07/29/17   Sebastian Ache, MD  methocarbamol (ROBAXIN) 500 MG tablet Take 2 tablets (1,000 mg total) by mouth 4 (four) times daily. Patient not taking: Reported on 11/11/2018 05/24/18   Renne Crigler, PA-C  nitrofurantoin, macrocrystal-monohydrate, (MACROBID) 100  MG capsule Take 1 capsule (100 mg total) by mouth 2 (two) times daily. 11/11/18   Kirichenko, Tatyana, PA-C  senna-docusate (SENOKOT-S) 8.6-50 MG tablet Take 1 tablet by mouth 2 (two) times daily. While taking strongest pain meds to prevent constipation. Please label in Spanish. Patient not taking: Reported on 11/11/2018 07/15/17   Sebastian Ache, MD    Family History No family history on file.  Social History Social History   Tobacco Use  . Smoking status: Never Smoker  . Smokeless tobacco: Never Used  Substance Use Topics  . Alcohol use: No  . Drug use: No     Allergies   Patient has no known allergies.   Review of Systems Review of Systems  All other systems reviewed and are negative.    Physical Exam Updated Vital Signs BP 132/87 (BP Location: Right Arm)   Pulse 87   Temp 98.3 F (36.8 C) (Oral)   Resp 18   Ht 5\' 3"  (1.6 m)   Wt 90.7 kg   LMP 12/11/2018 Comment: tubal ligation  SpO2 99%   BMI 35.43 kg/m   Physical Exam Vitals signs and nursing note reviewed.  Constitutional:      Appearance: She is well-developed.  HENT:     Head: Normocephalic and atraumatic.  Eyes:     Conjunctiva/sclera: Conjunctivae normal.     Pupils: Pupils are equal, round, and reactive to light.  Neck:     Musculoskeletal: Normal range of motion and neck supple.  Cardiovascular:     Rate and Rhythm: Normal rate and regular rhythm.     Heart sounds: No murmur. No friction rub. No gallop.      Comments: Anterior chest wall mildly tender to palpation, no crepitus, no bony deformity Pulmonary:     Effort: Pulmonary effort is normal. No respiratory distress.     Breath sounds: Normal breath sounds. No wheezing or rales.  Chest:     Chest wall: No tenderness.  Abdominal:     General: Bowel sounds are normal. There is no distension.     Palpations: Abdomen is soft. There is no mass.     Tenderness: There is no abdominal tenderness. There is no guarding or rebound.    Musculoskeletal: Normal range of motion.        General: No tenderness.     Comments: Left clavicle unremarkable to palpation, she does have some left upper trapezius tenderness  Skin:    General: Skin is warm and dry.  Neurological:     Mental Status: She is alert and oriented to person, place, and time.     Comments: CN III-XII intact, speech is clear, movements are goal oriented, normal finger-nose, no pronator drift, normal gait  Psychiatric:        Behavior: Behavior normal.        Thought Content: Thought content normal.        Judgment: Judgment normal.      ED Treatments / Results  Labs (all labs ordered are listed, but only abnormal results are displayed) Labs Reviewed  CBC WITH DIFFERENTIAL/PLATELET -  Abnormal; Notable for the following components:      Result Value   WBC 10.8 (*)    Lymphs Abs 4.3 (*)    All other components within normal limits  BASIC METABOLIC PANEL - Abnormal; Notable for the following components:   Glucose, Bld 100 (*)    All other components within normal limits  I-STAT TROPONIN, ED    EKG EKG Interpretation  Date/Time:  Monday December 27 2018 00:51:18 EST Ventricular Rate:  90 PR Interval:    QRS Duration: 93 QT Interval:  349 QTC Calculation: 427 R Axis:   8 Text Interpretation:  Sinus rhythm RSR' in V1 or V2, right VCD or RVH No old tracing to compare Confirmed by Ward, Baxter HireKristen [phone]([id number]) on 12/27/2018 2:19:18 AM   Radiology Dg Chest 2 View  Result Date: 12/27/2018 CLINICAL DATA:  Chest pain, shortness of breath EXAM: CHEST - 2 VIEW COMPARISON:  None. FINDINGS: Lungs are clear.  No pleural effusion or pneumothorax. The heart is normal in size. Visualized osseous structures are within normal limits. IMPRESSION: Normal chest radiographs. Electronically Signed   By: Charline BillsSriyesh  Krishnan M.D.   On: 12/27/2018 01:49    Procedures Procedures (including critical care time)  Medications Ordered in ED Medications - No data to  display   Initial Impression / Assessment and Plan / ED Course  I have reviewed the triage vital signs and the nursing notes.  Pertinent labs & imaging results that were available during my care of the patient were reviewed by me and considered in my medical decision making (see chart for details).     Patient with left-sided chest pain and left facial tingling that started suddenly tonight.  She states that started while driving.  She did not have any lower extremity pain or swelling.  She is not tachycardic or hypoxic.  I doubt PE or DVT.  She is low risk for ACS given age and lack of risk factors.  No ischemic changes on EKG.  Laboratory work-up is reassuring. Chest x-ray is negative, no evidence of infection.  Pain is reproducible with palpation, and she does have tenderness over the left upper trapezius.  This could be causing her symptoms.  She also questions whether it could be stress/anxiety-induced.  This is also certainly possible.  At this time, do not feel the patient requires any additional emergency department work-up, and believe her to be stable for discharge with outpatient follow-up.  Final Clinical Impressions(s) / ED Diagnoses   Final diagnoses:  Chest pain, unspecified type  Anxiety    ED Discharge Orders         Ordered    ibuprofen (ADVIL,MOTRIN) 800 MG tablet  Every 8 hours PRN     12/27/18 0242           Roxy HorsemanBrowning, Symone Cornman, PA-C 12/27/18 0245    Ward, Layla MawKristen N, DO 12/27/18 [phone]

## 2018-12-27 NOTE — Discharge Instructions (Addendum)
Return if your symptoms worsen

## 2018-12-27 NOTE — ED Triage Notes (Signed)
Pt arrives POV with c/o sudden onset of left sided chest pain, and a tingling feeling to her left side of her face. Pt reports she has has swelling in her left clavicle unsure of how or when I started.

## 2019-08-30 ENCOUNTER — Encounter (HOSPITAL_COMMUNITY): Payer: Self-pay | Admitting: Emergency Medicine

## 2019-08-30 ENCOUNTER — Emergency Department (HOSPITAL_COMMUNITY)
Admission: EM | Admit: 2019-08-30 | Discharge: 2019-08-30 | Disposition: A | Discharge status: Home / Self Care | Payer: Self-pay | Attending: Emergency Medicine | Admitting: Emergency Medicine

## 2019-08-30 ENCOUNTER — Other Ambulatory Visit: Payer: Self-pay

## 2019-08-30 DIAGNOSIS — Z79899 Other long term (current) drug therapy: Secondary | ICD-10-CM | POA: Insufficient documentation

## 2019-08-30 DIAGNOSIS — K2900 Acute gastritis without bleeding: Secondary | ICD-10-CM | POA: Insufficient documentation

## 2019-08-30 HISTORY — DX: Unspecified asthma, uncomplicated: J45.909

## 2019-08-30 LAB — CBC
HCT: 41.4 % (ref 36.0–46.0)
Hemoglobin: 14.6 g/dL (ref 12.0–15.0)
MCH: 32.2 pg (ref 26.0–34.0)
MCHC: 35.3 g/dL (ref 30.0–36.0)
MCV: 91.4 fL (ref 80.0–100.0)
Platelets: 277 10*3/uL (ref 150–400)
RBC: 4.53 MIL/uL (ref 3.87–5.11)
RDW: 11.5 % (ref 11.5–15.5)
WBC: 8.8 10*3/uL (ref 4.0–10.5)
nRBC: 0 % (ref 0.0–0.2)

## 2019-08-30 LAB — URINALYSIS, ROUTINE W REFLEX MICROSCOPIC
Bilirubin Urine: NEGATIVE
Glucose, UA: NEGATIVE mg/dL
Hgb urine dipstick: NEGATIVE
Ketones, ur: NEGATIVE mg/dL
Nitrite: NEGATIVE
Protein, ur: NEGATIVE mg/dL
Specific Gravity, Urine: 1.021 (ref 1.005–1.030)
pH: 6 (ref 5.0–8.0)

## 2019-08-30 LAB — COMPREHENSIVE METABOLIC PANEL
ALT: 30 U/L (ref 0–44)
AST: 29 U/L (ref 15–41)
Albumin: 4 g/dL (ref 3.5–5.0)
Alkaline Phosphatase: 71 U/L (ref 38–126)
Anion gap: 10 (ref 5–15)
BUN: 12 mg/dL (ref 6–20)
CO2: 20 mmol/L — ABNORMAL LOW (ref 22–32)
Calcium: 8.9 mg/dL (ref 8.9–10.3)
Chloride: 105 mmol/L (ref 98–111)
Creatinine, Ser: 0.94 mg/dL (ref 0.44–1.00)
GFR calc Af Amer: >60 mL/min (ref >60)
GFR calc non Af Amer: >60 mL/min (ref >60)
Glucose, Bld: 121 mg/dL — ABNORMAL HIGH (ref 70–99)
Potassium: 4.1 mmol/L (ref 3.5–5.1)
Sodium: 135 mmol/L (ref 135–145)
Total Bilirubin: 0.4 mg/dL (ref 0.3–1.2)
Total Protein: 6.8 g/dL (ref 6.5–8.1)

## 2019-08-30 LAB — I-STAT BETA HCG BLOOD, ED (MC, WL, AP ONLY): I-stat hCG, quantitative: <5 m[IU]/mL (ref <5)

## 2019-08-30 LAB — LIPASE, BLOOD: Lipase: 23 U/L (ref 11–51)

## 2019-08-30 MED ORDER — SODIUM CHLORIDE 0.9% FLUSH: 3.0000 mL | Freq: Once | INTRAVENOUS | Status: DC

## 2019-08-30 MED ORDER — FAMOTIDINE 20 MG PO TABS: 20.0000 mg | ORAL_TABLET | Freq: Two times a day (BID) | ORAL | 0 refills | Status: DC

## 2019-08-30 MED ORDER — OMEPRAZOLE 20 MG PO CPDR: 20.0000 mg | DELAYED_RELEASE_CAPSULE | Freq: Every day | ORAL | 0 refills | Status: DC

## 2019-08-30 NOTE — ED Notes (Signed)
Patient verbalized understanding of discharge instructions and denies any further needs or questions at this time. VS stable. Patient ambulatory with steady gait.  

## 2019-08-30 NOTE — ED Provider Notes (Signed)
[facility] EMERGENCY [facility]PARTMENT Provider Note   CSN: [id number] Arrival date & time: 08/30/19  1307     History   Chief Complaint Chief Complaint  Patient presents with  . Abdominal Pain  . Emesis    HPI Jill Riley is a 33 y.o. female.     HPI   33 year old female presents today with complaints of epigastric abdominal pain.  Patient notes over the last week she has had epigastric abdominal pain that is remained consistent, she notes this is worse after eating or drinking.  She notes it is a burning sensation.  She denies any lower abdominal pain or right upper quadrant abdominal pain.  She denies any fever or changes in bowel or bladder habits or characteristics.  She denies any history of the same.  She is not pregnant or breast-feeding.  She has tried Pepto-Bismol which did not improve her symptoms.  Past Medical History:  Diagnosis Date  . Asthma   . Dysuria   . Flank pain   . History of kidney stones   . Left ureteral calculus    residual stone s/p  1st stage laser lithotripsy 07-15-2017  . Retained ureteral stent    right side  . Wears glasses     There are no active problems to display for this patient.   Past Surgical History:  Procedure Laterality Date  . CYSTOSCOPY W/ URETERAL STENT REMOVAL Bilateral 07/29/2017   Procedure: CYSTOSCOPY WITH STENT REMOVAL;  Surgeon: Sebastian AcheManny, Theodore, MD;  [facility] [place];  Service: Urology;  Laterality: Bilateral;  . CYSTOSCOPY WITH RETROGRADE PYELOGRAM, URETEROSCOPY AND STENT PLACEMENT Bilateral 07/15/2017   Procedure: CYSTOSCOPY WITH bilateral RETROGRADE PYELOGRAM, right URETEROSCOPY, left diagnostic ureteroscopy AND STENT PLACEMENT;  Surgeon: Sebastian AcheManny, Theodore, MD;  [facility] [place];  Service: Urology;  Laterality: Bilateral;  . CYSTOSCOPY WITH RETROGRADE PYELOGRAM, URETEROSCOPY AND STENT PLACEMENT Left 07/29/2017   Procedure: CYSTOSCOPY WITH RETROGRADE  PYELOGRAM, URETEROSCOPY AND STENT REPLACEMENT LEFT URETER;  Surgeon: Sebastian AcheManny, Theodore, MD;  [facility] [place];  Service: Urology;  Laterality: Left;  . HOLMIUM LASER APPLICATION Bilateral 07/15/2017   Procedure: HOLMIUM LASER APPLICATION;  Surgeon: Sebastian AcheManny, Theodore, MD;  [facility] [place];  Service: Urology;  Laterality: Bilateral;  . HOLMIUM LASER APPLICATION Left 07/29/2017   Procedure: HOLMIUM LASER APPLICATION;  Surgeon: Sebastian AcheManny, Theodore, MD;  [facility] [place];  Service: Urology;  Laterality: Left;  . TENDON REPAIR Left 04/21/2017   Procedure: LEFT THUMB REPAIR EXTENSOR  TENDON;  Surgeon: Betha LoaKuzma, Kevin, MD;  Location: [facility];  Service: Orthopedics;  Laterality: Left;  . TUBAL LIGATION Bilateral 2013   [place]   "had procedure to not have children any more"     OB History   No obstetric history on file.      Home Medications    Prior to Admission medications   Medication Sig Start Date End Date Taking? Authorizing Provider  benzonatate (TESSALON) 100 MG capsule Take 1 capsule (100 mg total) by mouth every 8 (eight) hours. 12/12/18   Ward, Chase PicketJaime Pilcher, PA-C  famotidine (PEPCID) 20 MG tablet Take 1 tablet (20 mg total) by mouth 2 (two) times daily. 08/30/19   Khalik Pewitt, Tinnie GensJeffrey, PA-C  ibuprofen (ADVIL,MOTRIN) 800 MG tablet Take 1 tablet (800 mg total) by mouth every 8 (eight) hours as needed. 12/27/18   Roxy HorsemanBrowning, Robert, PA-C  ketorolac (TORADOL) 10 MG tablet Take 1 tablet (10 mg total) by mouth every  6 (six) hours as needed for moderate pain. And stent discomfort post-operatively. Pt has tolerated prior. Please label in Spanish. Patient not taking: Reported on 11/11/2018 07/29/17   Sebastian Ache, MD  methocarbamol (ROBAXIN) 500 MG tablet Take 2 tablets (1,000 mg total) by mouth 4 (four) times daily. Patient not taking: Reported on 11/11/2018 05/24/18   Renne Crigler, PA-C  nitrofurantoin, macrocrystal-monohydrate,  (MACROBID) 100 MG capsule Take 1 capsule (100 mg total) by mouth 2 (two) times daily. 11/11/18   Kirichenko, Lemont Fillers, PA-C  omeprazole (PRILOSEC) 20 MG capsule Take 1 capsule (20 mg total) by mouth daily. 08/30/19   Augustin Bun, Tinnie Gens, PA-C  senna-docusate (SENOKOT-S) 8.6-50 MG tablet Take 1 tablet by mouth 2 (two) times daily. While taking strongest pain meds to prevent constipation. Please label in Spanish. Patient not taking: Reported on 11/11/2018 07/15/17   Sebastian Ache, MD    Family History No family history on file.  Social History Social History   Tobacco Use  . Smoking status: Never Smoker  . Smokeless tobacco: Never Used  Substance Use Topics  . Alcohol use: No  . Drug use: No     Allergies   Patient has no known allergies.   Review of Systems Review of Systems  All other systems reviewed and are negative.    Physical Exam Updated Vital Signs BP 128/77   Pulse 99   Temp 98.5 F (36.9 C) (Oral)   Resp 16   LMP 04/29/2019   SpO2 98%   Physical Exam Vitals signs and nursing note reviewed.  Constitutional:      Appearance: She is well-developed.  HENT:     Head: Normocephalic and atraumatic.  Eyes:     General: No scleral icterus.       Right eye: No discharge.        Left eye: No discharge.     Conjunctiva/sclera: Conjunctivae normal.     Pupils: Pupils are equal, round, and reactive to light.  Neck:     Musculoskeletal: Normal range of motion.     Vascular: No JVD.     Trachea: No tracheal deviation.  Pulmonary:     Effort: Pulmonary effort is normal.     Breath sounds: No stridor.  Abdominal:     Comments: Minimal pain to the epigastric region with palpation, no right upper quadrant or remainder abdomen tenderness  Neurological:     Mental Status: She is alert and oriented to person, place, and time.     Coordination: Coordination normal.  Psychiatric:        Behavior: Behavior normal.        Thought Content: Thought content normal.         Judgment: Judgment normal.      ED Treatments / Results  Labs (all labs ordered are listed, but only abnormal results are displayed) Labs Reviewed  COMPREHENSIVE METABOLIC PANEL - Abnormal; Notable for the following components:      Result Value   CO2 20 (*)    Glucose, Bld 121 (*)    All other components within normal limits  URINALYSIS, ROUTINE W REFLEX MICROSCOPIC - Abnormal; Notable for the following components:   APPearance HAZY (*)    Leukocytes,Ua TRACE (*)    Bacteria, UA RARE (*)    All other components within normal limits  LIPASE, BLOOD  CBC  I-STAT BETA HCG BLOOD, ED ([facility], [facility], [facility] ONLY)    EKG None  Radiology No results found.  Procedures Procedures (including critical care time)  Medications Ordered in ED Medications  sodium chloride flush (NS) 0.9 % injection 3 mL (has no administration in time range)     Initial Impression / Assessment and Plan / ED Course  I have reviewed the triage vital signs and the nursing notes.  Pertinent labs & imaging results that were available during my care of the patient were reviewed by me and considered in my medical decision making (see chart for details).        33 year old female presents today with epigastric abdominal pain likely secondary to gastritis.  She has no signs of acute intra-abdominal pathology that require further evaluation or management at this time.  She is afebrile with no elevation of white count.  She has no right upper quadrant abdominal pain labs are reassuring with no signs of obstructive pathology.  No lower abdominal pain.  Patient will be treated with Pepcid followed by Prilosec if her symptoms are not improving.  She will follow-up as an outpatient with her primary care if symptoms persist return if they worsen.  She verbalized understanding and agreement to today's plan had no further questions or concerns at time of discharge. Final Clinical Impressions(s) / ED Diagnoses   Final diagnoses:   Acute gastritis, presence of bleeding unspecified, unspecified gastritis type    ED Discharge Orders         Ordered    famotidine (PEPCID) 20 MG tablet  2 times daily     08/30/19 1629    omeprazole (PRILOSEC) 20 MG capsule  Daily     08/30/19 1629           Okey Regal, PA-C 08/30/19 1629    Drenda Freeze, MD 08/31/19 [phone]

## 2019-08-30 NOTE — Discharge Instructions (Signed)
Please read attached information. If you experience any new or worsening signs or symptoms please return to the emergency room for evaluation. Please follow-up with your primary care provider or specialist as discussed. Please use medication prescribed only as directed and discontinue taking if you have any concerning signs or symptoms.   °

## 2019-08-30 NOTE — ED Triage Notes (Signed)
Pt here for 1 week of dizziness, emesis abdominal pain. States the pain is worse when she eats, pain currently 9/10. Diarrhea x1 today but just a little.

## 2019-11-24 IMAGING — CR DG CHEST 2V
2 series · 2 of 2 positions shown · non-contrast
Comparison: None.

CLINICAL DATA: Chest pain, shortness of breath

EXAM:
CHEST - 2 VIEW

[chest pa]
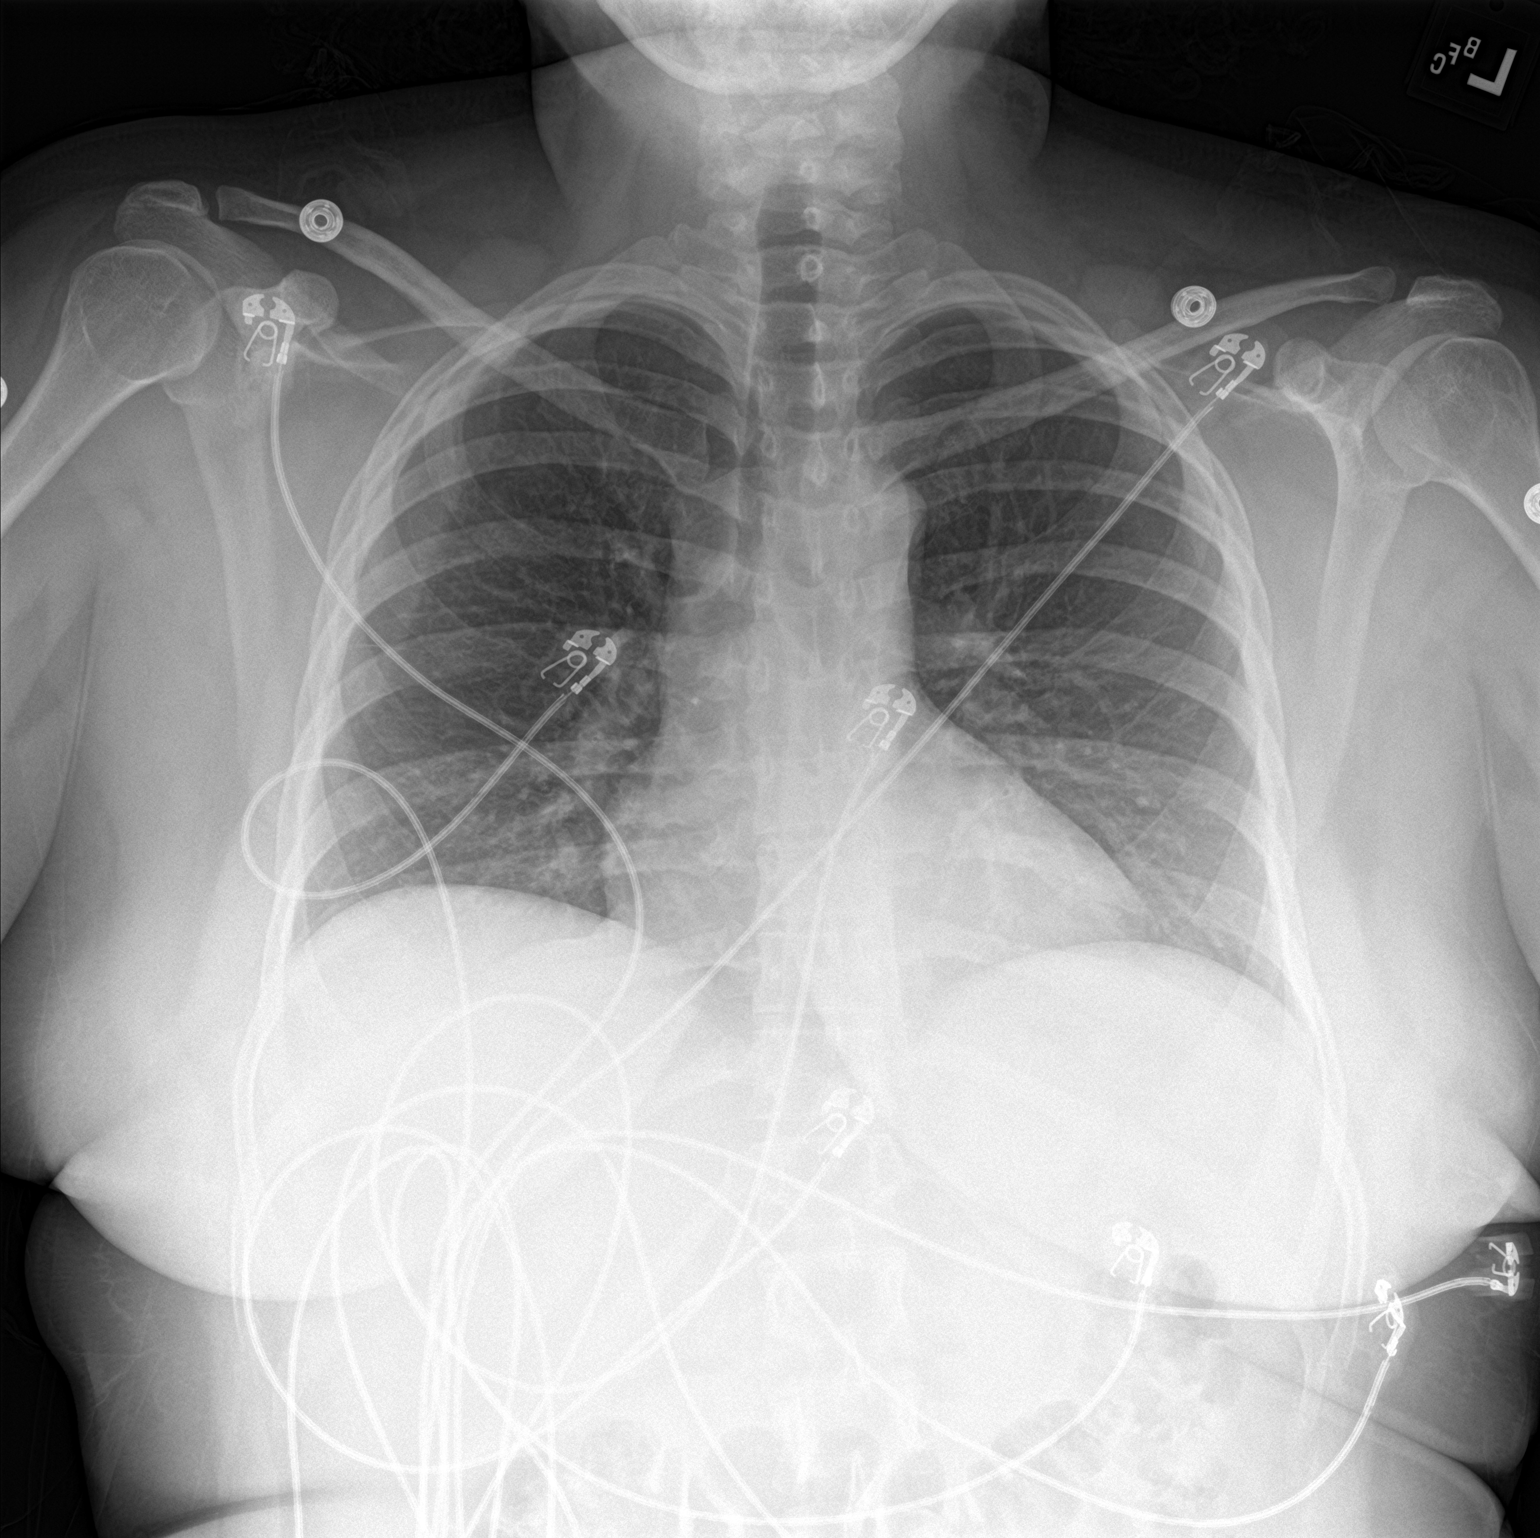

[chest lat]
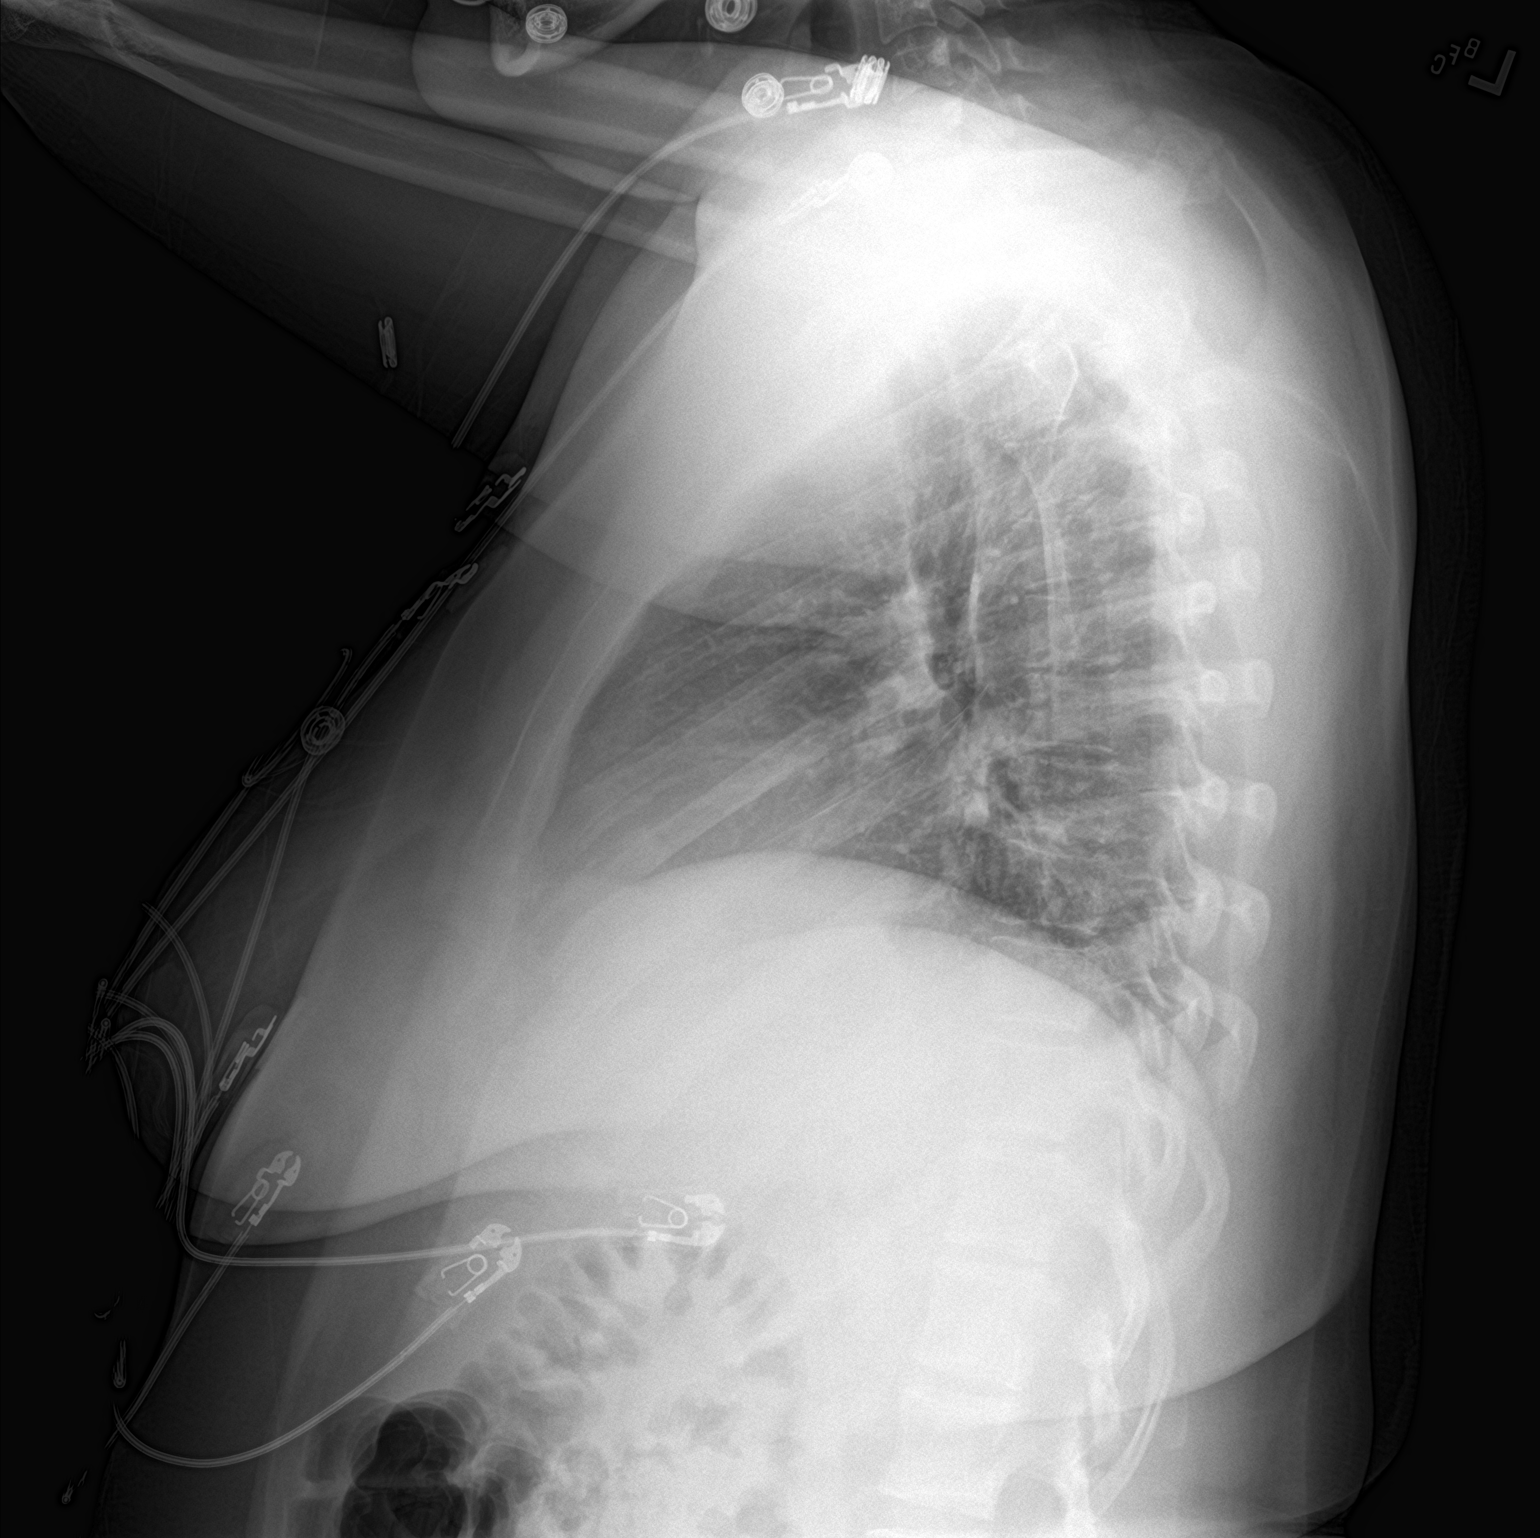

[2 of 2 positions shown; findings below may reference images not displayed]

FINDINGS: Lungs are clear.  No pleural effusion or pneumothorax.

The heart is normal in size.

Visualized osseous structures are within normal limits.
IMPRESSION: Normal chest radiographs.

## 2020-05-21 ENCOUNTER — Other Ambulatory Visit: Payer: Self-pay

## 2020-05-21 ENCOUNTER — Emergency Department (HOSPITAL_COMMUNITY)
Admission: EM | Admit: 2020-05-21 | Discharge: 2020-05-21 | Disposition: A | Discharge status: Home / Self Care | Payer: Self-pay | Attending: Emergency Medicine | Admitting: Emergency Medicine

## 2020-05-21 ENCOUNTER — Encounter (HOSPITAL_COMMUNITY): Payer: Self-pay | Admitting: Emergency Medicine

## 2020-05-21 ENCOUNTER — Emergency Department (HOSPITAL_COMMUNITY): Payer: Self-pay

## 2020-05-21 DIAGNOSIS — Z960 Presence of urogenital implants: Secondary | ICD-10-CM | POA: Insufficient documentation

## 2020-05-21 DIAGNOSIS — N83202 Unspecified ovarian cyst, left side: Secondary | ICD-10-CM | POA: Insufficient documentation

## 2020-05-21 DIAGNOSIS — N939 Abnormal uterine and vaginal bleeding, unspecified: Secondary | ICD-10-CM

## 2020-05-21 DIAGNOSIS — J45909 Unspecified asthma, uncomplicated: Secondary | ICD-10-CM | POA: Insufficient documentation

## 2020-05-21 DIAGNOSIS — N938 Other specified abnormal uterine and vaginal bleeding: Secondary | ICD-10-CM | POA: Insufficient documentation

## 2020-05-21 DIAGNOSIS — N949 Unspecified condition associated with female genital organs and menstrual cycle: Secondary | ICD-10-CM

## 2020-05-21 DIAGNOSIS — R102 Pelvic and perineal pain: Secondary | ICD-10-CM | POA: Insufficient documentation

## 2020-05-21 LAB — URINALYSIS, ROUTINE W REFLEX MICROSCOPIC

## 2020-05-21 LAB — CBC
HCT: 41.9 % (ref 36.0–46.0)
Hemoglobin: 14.2 g/dL (ref 12.0–15.0)
MCH: 31.6 pg (ref 26.0–34.0)
MCHC: 33.9 g/dL (ref 30.0–36.0)
MCV: 93.1 fL (ref 80.0–100.0)
Platelets: 278 10*3/uL (ref 150–400)
RBC: 4.5 MIL/uL (ref 3.87–5.11)
RDW: 12 % (ref 11.5–15.5)
WBC: 6.6 10*3/uL (ref 4.0–10.5)
nRBC: 0 % (ref 0.0–0.2)

## 2020-05-21 LAB — BASIC METABOLIC PANEL
Anion gap: 9 (ref 5–15)
BUN: 11 mg/dL (ref 6–20)
CO2: 21 mmol/L — ABNORMAL LOW (ref 22–32)
Calcium: 9.1 mg/dL (ref 8.9–10.3)
Chloride: 105 mmol/L (ref 98–111)
Creatinine, Ser: 0.59 mg/dL (ref 0.44–1.00)
GFR calc Af Amer: >60 mL/min (ref >60)
GFR calc non Af Amer: >60 mL/min (ref >60)
Glucose, Bld: 100 mg/dL — ABNORMAL HIGH (ref 70–99)
Potassium: 3.9 mmol/L (ref 3.5–5.1)
Sodium: 135 mmol/L (ref 135–145)

## 2020-05-21 LAB — URINALYSIS, MICROSCOPIC (REFLEX): RBC / HPF: >50 RBC/hpf (ref 0–5)

## 2020-05-21 LAB — WET PREP, GENITAL
Clue Cells Wet Prep HPF POC: NONE SEEN
Sperm: NONE SEEN
Trich, Wet Prep: NONE SEEN
Yeast Wet Prep HPF POC: NONE SEEN

## 2020-05-21 LAB — I-STAT BETA HCG BLOOD, ED (MC, WL, AP ONLY): I-stat hCG, quantitative: <5 m[IU]/mL (ref <5)

## 2020-05-21 MED ORDER — IBUPROFEN 600 MG PO TABS: 600.0000 mg | ORAL_TABLET | Freq: Four times a day (QID) | ORAL | 0 refills | Status: DC | PRN

## 2020-05-21 MED ORDER — KETOROLAC TROMETHAMINE 30 MG/ML IJ SOLN
30.0000 mg | Freq: Once | INTRAMUSCULAR | Status: AC
  Administered 2020-05-21: 30 mg via INTRAMUSCULAR
  Filled 2020-05-21: qty 1

## 2020-05-21 MED ORDER — MEGESTROL ACETATE 40 MG PO TABS: 40.0000 mg | ORAL_TABLET | Freq: Every day | ORAL | 0 refills | Status: DC

## 2020-05-21 NOTE — Discharge Instructions (Addendum)
Please take Megace once daily for a month.  You can take ibuprofen 600 mg every 6 hours, Tylenol 1000 mg every 6 hours as needed for breakthrough pain.  Ibuprofen can help with pain and bleeding.  Please call today or tomorrow to schedule a follow-up appointment with OB/GYN women's clinic for further evaluation of your bleeding and the adnexal cyst noted on your ultrasound today.  If you develop worsening pain, vomiting, fevers or any other new or concerning symptoms return to the ED for reevaluation.  Tome Megace una vez al da durante un mes. Puede tomar ibuprofeno 600 mg cada 6 horas, Tylenol 1000 mg cada 6 horas segn sea necesario para el dolor irruptivo. El ibuprofeno puede ayudar con el dolor y [place]. Llame hoy o maana para programar una cita de seguimiento con la clnica de mujeres OB / GYN para una evaluacin adicional de su sangrado y el quiste anexial que se not en su ultrasonido hoy. Si empeora el dolor, los vmitos, la fiebre o cualquier otro sntoma nuevo o preocupante, regrese al servicio de urgencias para una reevaluacin.

## 2020-05-21 NOTE — ED Provider Notes (Addendum)
[facility] EMERGENCY DEPARTMENT Provider Note   CSN: [id number] Arrival date & time: 05/21/20  7017     History Chief Complaint  Patient presents with  . Vaginal Bleeding    Jill Riley is a 34 y.o. female.  Jill Riley is a 34 y.o. female with a history of kidney stones, dysuria, asthma, who presents to the emergency department for evaluation of vaginal bleeding. Patient states that she has had heavy vaginal bleeding that began on Friday when she was picking up something heavy at work.  She felt like something popped in her lower abdomen and she has been having heavy vaginal bleeding with clots ever since then.  She reports having to change her pad every 30 minutes to an hour.  She states that prior to this episode of bleeding she has not had a menstrual cycle in 7 months.  Reports that she had a tubal ligation back in 2013, but has had no other surgeries or history of abnormal vaginal bleeding.  She does not know if she has ever had any ovarian cyst.  Patient does not think that she could currently be pregnant.  She denies any associated dysuria, urinary frequency or hematuria.  No blood in her stools.  She reports pain to the mid abdomen bilaterally that she reports is worse with movement.  No nausea or vomiting.  No fevers or chills.  She has not noted any vaginal discharge and denies concern for sexually transmitted infection.  Does not have an OB/GYN here locally.  No other aggravating or alleviating factors.  History obtained using Spanish interpreter.        Past Medical History:  Diagnosis Date  . Asthma   . Dysuria   . Flank pain   . History of kidney stones   . Left ureteral calculus    residual stone s/p  1st stage laser lithotripsy 07-15-2017  . Retained ureteral stent    right side  . Wears glasses     There are no problems to display for this patient.   Past Surgical History:  Procedure Laterality Date    . CYSTOSCOPY W/ URETERAL STENT REMOVAL Bilateral 07/29/2017   Procedure: CYSTOSCOPY WITH STENT REMOVAL;  Surgeon: Sebastian Ache, MD;  Location: [facility];  Service: Urology;  Laterality: Bilateral;  . CYSTOSCOPY WITH RETROGRADE PYELOGRAM, URETEROSCOPY AND STENT PLACEMENT Bilateral 07/15/2017   Procedure: CYSTOSCOPY WITH bilateral RETROGRADE PYELOGRAM, right URETEROSCOPY, left diagnostic ureteroscopy AND STENT PLACEMENT;  Surgeon: Sebastian Ache, MD;  Location: [facility];  Service: Urology;  Laterality: Bilateral;  . CYSTOSCOPY WITH RETROGRADE PYELOGRAM, URETEROSCOPY AND STENT PLACEMENT Left 07/29/2017   Procedure: CYSTOSCOPY WITH RETROGRADE PYELOGRAM, URETEROSCOPY AND STENT REPLACEMENT LEFT URETER;  Surgeon: Sebastian Ache, MD;  Location: [facility];  Service: Urology;  Laterality: Left;  . HOLMIUM LASER APPLICATION Bilateral 07/15/2017   Procedure: HOLMIUM LASER APPLICATION;  Surgeon: Sebastian Ache, MD;  Location: [facility];  Service: Urology;  Laterality: Bilateral;  . HOLMIUM LASER APPLICATION Left 07/29/2017   Procedure: HOLMIUM LASER APPLICATION;  Surgeon: Sebastian Ache, MD;  Location: [facility];  Service: Urology;  Laterality: Left;  . TENDON REPAIR Left 04/21/2017   Procedure: LEFT THUMB REPAIR EXTENSOR  TENDON;  Surgeon: Betha Loa, MD;  Location: [facility];  Service: Orthopedics;  Laterality: Left;  . TUBAL LIGATION Bilateral 2013   [place]   "had procedure to not have children any more"  OB History   No obstetric history on file.     History reviewed. No pertinent family history.  Social History   Tobacco Use  . Smoking status: Never Smoker  . Smokeless tobacco: Never Used  Substance Use Topics  . Alcohol use: No  . Drug use: No    Home Medications Prior to Admission medications   Medication Sig Start Date End Date Taking? Authorizing Provider  benzonatate  (TESSALON) 100 MG capsule Take 1 capsule (100 mg total) by mouth every 8 (eight) hours. 12/12/18   Ward, Chase Picket, PA-C  famotidine (PEPCID) 20 MG tablet Take 1 tablet (20 mg total) by mouth 2 (two) times daily. 08/30/19   Hedges, Tinnie Gens, PA-C  ibuprofen (ADVIL,MOTRIN) 800 MG tablet Take 1 tablet (800 mg total) by mouth every 8 (eight) hours as needed. 12/27/18   Roxy Horseman, PA-C  ketorolac (TORADOL) 10 MG tablet Take 1 tablet (10 mg total) by mouth every 6 (six) hours as needed for moderate pain. And stent discomfort post-operatively. Pt has tolerated prior. Please label in Spanish. Patient not taking: Reported on 11/11/2018 07/29/17   Sebastian Ache, MD  methocarbamol (ROBAXIN) 500 MG tablet Take 2 tablets (1,000 mg total) by mouth 4 (four) times daily. Patient not taking: Reported on 11/11/2018 05/24/18   Renne Crigler, PA-C  nitrofurantoin, macrocrystal-monohydrate, (MACROBID) 100 MG capsule Take 1 capsule (100 mg total) by mouth 2 (two) times daily. 11/11/18   Kirichenko, Lemont Fillers, PA-C  omeprazole (PRILOSEC) 20 MG capsule Take 1 capsule (20 mg total) by mouth daily. 08/30/19   Hedges, Tinnie Gens, PA-C  senna-docusate (SENOKOT-S) 8.6-50 MG tablet Take 1 tablet by mouth 2 (two) times daily. While taking strongest pain meds to prevent constipation. Please label in Spanish. Patient not taking: Reported on 11/11/2018 07/15/17   Sebastian Ache, MD    Allergies    Patient has no known allergies.  Review of Systems   Review of Systems  Constitutional: Negative for chills and fever.  HENT: Negative.   Respiratory: Negative for cough and shortness of breath.   Cardiovascular: Negative for chest pain.  Gastrointestinal: Positive for abdominal pain. Negative for blood in stool, constipation, diarrhea, nausea and vomiting.  Genitourinary: Positive for pelvic pain and vaginal bleeding. Negative for dysuria, flank pain, frequency, hematuria and vaginal discharge.  Musculoskeletal: Negative for  arthralgias and myalgias.  Skin: Negative for color change and rash.  Neurological: Negative for dizziness, syncope and light-headedness.    Physical Exam Updated Vital Signs BP 135/83 (BP Location: Right Arm)   Pulse 87   Temp 98 F (36.7 C) (Oral)   Resp 16   SpO2 100%   Physical Exam Vitals and nursing note reviewed.  Constitutional:      General: She is not in acute distress.    Appearance: She is well-developed. She is obese. She is not diaphoretic.     Comments: Well-appearing and in no distress  HENT:     Head: Normocephalic and atraumatic.  Eyes:     General:        Right eye: No discharge.        Left eye: No discharge.     Conjunctiva/sclera: Conjunctivae normal.  Cardiovascular:     Rate and Rhythm: Normal rate and regular rhythm.     Heart sounds: Normal heart sounds. No murmur heard.  No friction rub. No gallop.   Pulmonary:     Effort: Pulmonary effort is normal. No respiratory distress.     Breath sounds: Normal breath sounds.  No wheezing or rales.     Comments: Respirations equal and unlabored, patient able to speak in full sentences, lungs clear to auscultation bilaterally Abdominal:     General: Bowel sounds are normal. There is no distension.     Palpations: Abdomen is soft. There is no mass.     Tenderness: There is abdominal tenderness. There is no guarding.     Comments: Abdomen is soft, nondistended, bowel sounds present, there is some tenderness in the right and left mid abdomen and pelvic area without guarding or rebound tenderness.  No CVA tenderness bilaterally  Genitourinary:    Comments: Chaperone present during pelvic exam. No external genital lesions noted. On speculum exam patient with a moderate amount of blood pooled in the vaginal vault and some dark red blood coming from the cervical os, a few clots present, blood cleared with fox swabs, no discharge or cervical erythema noted. On bimanual exam patient does not have any cervical motion  tenderness, she does have some left adnexal discomfort without palpable mass Musculoskeletal:        General: No deformity.     Cervical back: Neck supple.  Skin:    General: Skin is warm and dry.     Capillary Refill: Capillary refill takes less than 2 seconds.  Neurological:     Mental Status: She is alert.     Coordination: Coordination normal.     Comments: Speech is clear, able to follow commands Moves extremities without ataxia, coordination intact  Psychiatric:        Mood and Affect: Mood normal.        Behavior: Behavior normal.     ED Results / Procedures / Treatments   Labs (all labs ordered are listed, but only abnormal results are displayed) Labs Reviewed  WET PREP, GENITAL - Abnormal; Notable for the following components:      Result Value   WBC, Wet Prep HPF POC RARE (*)    All other components within normal limits  BASIC METABOLIC PANEL - Abnormal; Notable for the following components:   CO2 21 (*)    Glucose, Bld 100 (*)    All other components within normal limits  URINALYSIS, ROUTINE W REFLEX MICROSCOPIC - Abnormal; Notable for the following components:   Color, Urine RED (*)    APPearance TURBID (*)    Glucose, UA   (*)    Value: TEST NOT REPORTED DUE TO COLOR INTERFERENCE OF URINE PIGMENT   Hgb urine dipstick   (*)    Value: TEST NOT REPORTED DUE TO COLOR INTERFERENCE OF URINE PIGMENT   Bilirubin Urine   (*)    Value: TEST NOT REPORTED DUE TO COLOR INTERFERENCE OF URINE PIGMENT   Ketones, ur   (*)    Value: TEST NOT REPORTED DUE TO COLOR INTERFERENCE OF URINE PIGMENT   Protein, ur   (*)    Value: TEST NOT REPORTED DUE TO COLOR INTERFERENCE OF URINE PIGMENT   Nitrite   (*)    Value: TEST NOT REPORTED DUE TO COLOR INTERFERENCE OF URINE PIGMENT   Leukocytes,Ua   (*)    Value: TEST NOT REPORTED DUE TO COLOR INTERFERENCE OF URINE PIGMENT   All other components within normal limits  URINALYSIS, MICROSCOPIC (REFLEX) - Abnormal; Notable for the following  components:   Bacteria, UA RARE (*)    All other components within normal limits  CBC  I-STAT BETA HCG BLOOD, ED ([facility], [facility], [facility] ONLY)  GC/CHLAMYDIA PROBE AMP ([place])  NOT AT [facility]    EKG None  Radiology US PELVIC COMPLETE W TRANSVAGINAL AND TORSION R/O  Result Date: 05/21/2020 CLINICAL DATA:  Pelvic pain for 3 days.  Vaginal bleeding. EXAM: TRANSABDOMINAL AND TRANSVAGINAL ULTRASOUND OF PELVIS DOPPLER ULTRASOUND OF OVARIES TECHNIQUE: Both transabdominal and transvaginal ultrasound examinations of the pelvis were performed. Transabdominal technique was performed for global imaging of the pelvis including uterus, ovaries, adnexal regions, and pelvic cul-de-sac. It was necessary to proceed with endovaginal exam following the transabdominal exam to visualize the uterus, ovaries, and adnexa. Color and duplex Doppler ultrasound was utilized to evaluate blood flow to the ovaries. COMPARISON:  11/11/2018 FINDINGS: Uterus Measurements: 9.1 x 4.6 x 6.2 cm = volume: 135 mL. No fibroids or other mass visualized. Endometrium Thickness: Normal, 10 mm.  No focal abnormality visualized. Right ovary Measurements: 5.1 x 2.2 x 2.9 cm = volume: 16.8 mL. Normal appearance/no adnexal mass. Left ovary Measurements: 4.9 x 2.3 x 2.4 cm = volume: 13.8 mL. Normal appearance/no adnexal mass. Pulsed Doppler evaluation of both ovaries demonstrates normal low-resistance arterial and venous waveforms. Other findings No significant free fluid. An extraovarian cystic structure within the left adnexa measures 4.8 x 1.2 x 2.7 cm, including on image 94. Somewhat tubular in morphology. IMPRESSION: 1. No evidence of ovarian/adnexal torsion. 2. Extra ovarian cystic lesion within the left adnexa, with differential considerations of hydrosalpinx or a peritoneal inclusion cyst. Electronically Signed   By: Jeronimo Greaves M.D.   On: 05/21/2020 13:41    Procedures Procedures (including critical care time)  Medications Ordered in ED Medications   ketorolac (TORADOL) 30 MG/ML injection 30 mg (30 mg Intramuscular Given 05/21/20 1430)    ED Course  I have reviewed the triage vital signs and the nursing notes.  Pertinent labs & imaging results that were available during my care of the patient were reviewed by me and considered in my medical decision making (see chart for details).    MDM Rules/Calculators/A&P                     34 year old female presents with days of heavy vaginal bleeding after not having a cycle for 7 months, she has had, no prior history of dysfunctional uterine bleeding, this is associated with pelvic and mid abdominal pain.  She is well-appearing, she is not having lightheadedness or near syncope due to blood loss.  No fevers or chills, no vaginal discharge or urinary symptoms.  Vitals are normal.  Will get abdominal labs as well as urinalysis, pregnancy, wet prep and GC chlamydia as well as pelvic ultrasound.  I have independently ordered, reviewed and interpreted all labs and imaging: CBC: No leukocytosis, hemoglobin of 14.2, very reassuring in the setting of bleeding BMP: Glucose 100, CO2 21, no other electrolyte derangements, normal renal function Urinalysis: Gross hematuria, but rare bacteria no WBCs, low suspicion for infection, hematuria is of vaginal source likely based on pelvic exam. Preg: Negative Wet prep: Rare WBCs, no other abnormalities  Pelvic ultrasound: No evidence of ovarian or adnexal torsion, there is an extra ovarian cystic lesion within the left adnexa, could be hydrosalpinx versus a peritoneal inclusion cyst, the endometrium appears normal without thickening, fibroids or masses noted.  I discussed the patient's continued vaginal bleeding with Dr. Jolayne Panther with OB/GYN who recommends starting patient on 40 mg daily of Megace, and they will see the patient in their clinic for close follow-up.  Patient can also use NSAIDs to help with discomfort, this may help with  bleeding as well.  I discuss  findings and OB/GYN recommendations with patient and her husband using Spanish interpreter at length, answered all questions.  At this time patient is stable for discharge home, prescriptions for Megace and ibuprofen have been sent into her pharmacy.  Discharged home in good condition.  Portions of this note were generated with Scientist, clinical (histocompatibility and immunogenetics)Dragon dictation software. Dictation errors may occur despite best attempts at proofreading.   Final Clinical Impression(s) / ED Diagnoses Final diagnoses:  Vaginal bleeding  Adnexal cyst    Rx / DC Orders ED Discharge Orders         Ordered    megestrol (MEGACE) 40 MG tablet  Daily     Discontinue  Reprint     05/21/20 1653    ibuprofen (ADVIL) 600 MG tablet  Every 6 hours PRN     Discontinue  Reprint     05/21/20 1653           Dartha LodgeFord, Khiree Bukhari N, PA-C 05/26/20 1053    [address] Manhattan LaneFord, Monia Timmers GrantsN, New JerseyPA-C 05/28/20 1128    Geoffery Lyonselo, [place], MD 05/29/20 [phone]

## 2020-05-21 NOTE — ED Triage Notes (Signed)
Patient arrives to ED with complaints of heavy vaginal bleeding that started on Friday when she was picking up something heavy.Patient states she heard something "pop" in her lower abdomen and that when the bleeding started. Patient states she hasn't had a menstrual cycle in months since her surgery.

## 2020-05-22 LAB — GC/CHLAMYDIA PROBE AMP (~~LOC~~) NOT AT ARMC
Chlamydia: NEGATIVE
Comment: NEGATIVE
Comment: NORMAL
Neisseria Gonorrhea: NEGATIVE

## 2020-05-28 ENCOUNTER — Other Ambulatory Visit: Payer: Self-pay

## 2020-05-29 ENCOUNTER — Encounter: Payer: Self-pay | Admitting: Obstetrics and Gynecology

## 2020-05-29 ENCOUNTER — Ambulatory Visit (INDEPENDENT_AMBULATORY_CARE_PROVIDER_SITE_OTHER): Payer: Self-pay | Admitting: Obstetrics and Gynecology

## 2020-05-29 VITALS — BP 122/80 | Ht 64.0 in | Wt 204.0 lb

## 2020-05-29 DIAGNOSIS — R198 Other specified symptoms and signs involving the digestive system and abdomen: Secondary | ICD-10-CM

## 2020-05-29 DIAGNOSIS — N939 Abnormal uterine and vaginal bleeding, unspecified: Secondary | ICD-10-CM

## 2020-05-29 MED ORDER — NORGESTIMATE-ETH ESTRADIOL 0.25-35 MG-MCG PO TABS: 1.0000 | ORAL_TABLET | Freq: Every day | ORAL | 4 refills | Status: DC

## 2020-05-29 NOTE — Progress Notes (Signed)
[place] Apr 25, 1986 [id number]  SUBJECTIVE:  34 y.o. [id number] female presents for irregular periods and bleeding.  She went to ED 05/21/2020 for evaluation of this problem.  Pelvic ultrasound was unremarkable other than a possible left paratubal cyst or hydrosalpinx.  She is using tubal ligation for contraception.  GC/Chlamydia testing negative, wet prep negative.  Hgb 14.2 at that time.  She has always had irregular periods since she had a tubal ligation after her last delivery 7 years ago.  She says she had gained weight after the tubal ligation.  Prior to the tubal ligation she says her periods are very regular and with normal flow.  Her periods became irregular, sometimes going a few months without bleeding, when she would get a period, she would have 1-2 days of very heavy bleeding.  This most recent time, she went 7 months without a period.  She used Provera and did not have any bleeding until she did a lot of lifting at work, and then had spotting that progressed to heavy bleeding.  Denies any pelvic pain.   A professional Spanish interpreter is present for the visit today.  Current Outpatient Medications  Medication Sig Dispense Refill  . ibuprofen (ADVIL) 600 MG tablet Take 1 tablet (600 mg total) by mouth every 6 (six) hours as needed. 30 tablet 0  . ketorolac (TORADOL) 10 MG tablet Take 1 tablet (10 mg total) by mouth every 6 (six) hours as needed for moderate pain. And stent discomfort post-operatively. Pt has tolerated prior. Please label in Spanish. 30 tablet 1  . megestrol (MEGACE) 40 MG tablet Take 1 tablet (40 mg total) by mouth daily. 30 tablet 0  . benzonatate (TESSALON) 100 MG capsule Take 1 capsule (100 mg total) by mouth every 8 (eight) hours. (Patient not taking: Reported on 05/29/2020) 21 capsule 0  . famotidine (PEPCID) 20 MG tablet Take 1 tablet (20 mg total) by mouth 2 (two) times daily. (Patient not taking: Reported on 05/29/2020) 30 tablet 0  .  methocarbamol (ROBAXIN) 500 MG tablet Take 2 tablets (1,000 mg total) by mouth 4 (four) times daily. (Patient not taking: Reported on 11/11/2018) 20 tablet 0  . nitrofurantoin, macrocrystal-monohydrate, (MACROBID) 100 MG capsule Take 1 capsule (100 mg total) by mouth 2 (two) times daily. (Patient not taking: Reported on 05/29/2020) 14 capsule 0  . omeprazole (PRILOSEC) 20 MG capsule Take 1 capsule (20 mg total) by mouth daily. (Patient not taking: Reported on 05/29/2020) 30 capsule 0  . senna-docusate (SENOKOT-S) 8.6-50 MG tablet Take 1 tablet by mouth 2 (two) times daily. While taking strongest pain meds to prevent constipation. Please label in Spanish. (Patient not taking: Reported on 05/29/2020) 20 tablet 0   No current facility-administered medications for this visit.   Allergies: Patient has no known allergies.  No LMP recorded (lmp unknown).  Past medical history,surgical history, problem list, medications, allergies, family history and social history were all reviewed and documented as reviewed in the EPIC chart.  ROS:  Feeling well. No dyspnea or chest pain on exertion.  No abdominal pain, change in bowel habits, black or bloody stools.  No urinary tract symptoms. GYN ROS: As described in HPI   OBJECTIVE:  BP 122/80   Ht 5\' 4"  (1.626 m)   Wt 204 lb (92.5 kg)   LMP  (LMP Unknown) Comment: Irreg.bleeding  BMI 35.02 kg/m  The patient appears well, alert, oriented x 3, in no distress. PELVIC EXAM: Deferred   ASSESSMENT:  34  y.o. [id number] here for acute exacerbation of chronic abnormal uterine bleeding, right hydrosalpinx versus paratubal cyst  PLAN:  Reassuring lab work and normal pelvic ultrasound other than a right hydrosalpinx versus paratubal cyst.  We reviewed the pelvic ultrasound images and I reassured the patient that she does not have any findings that would explain the abnormal bleeding.  We discussed that sometimes this is a phenomenon we see after tubal ligation.  We  discussed the fluid collection in the right adnexa is not a thing to worry about as measures very small and she is asymptomatic.  If developing right-sided pain symptoms in the future, I would recommend repeating pelvic imaging to see if there has been interval growth of this fluid collection. Will try Sprintec equivalent for next several months using standard dosing regimen.  Common potential side effects of OCP use are reviewed.  She has no medical contraindication to use of OCPs, denying any thrombotic disease history, she is a non-smoker.  Blood pressure is normal.  No other medical problems.  She is told she can stop the Megace now. Will return in 3-4 months if having any problems or ongoing irregular bleeding, and at that time will consider checking other lab work (TSH, coagulation panel) and possibly look into other hormonal contraception options to help with menstrual bleeding control.   Theresia Majors MD 05/29/20

## 2020-06-25 ENCOUNTER — Encounter: Payer: Self-pay | Admitting: Obstetrics and Gynecology

## 2020-07-25 ENCOUNTER — Telehealth: Payer: Self-pay

## 2020-07-25 MED ORDER — NORETHIN ACE-ETH ESTRAD-FE 1-20 MG-MCG PO TABS: 1.0000 | ORAL_TABLET | Freq: Every day | ORAL | 3 refills | Status: DC

## 2020-07-25 NOTE — Telephone Encounter (Signed)
Message from front desk/Spanish interpretor:  "Dr. Melvenia Needles prescribed Ortho-Cyclen .25-35 mg on June 15 patient states pill makes her nauseous and dizzy. She does take pill after breakfast. Patient wants to know if Dr. Melvenia Needles can change her to different prescription."

## 2020-07-25 NOTE — Telephone Encounter (Signed)
Rx sent. Claudia notified patient.

## 2020-07-25 NOTE — Telephone Encounter (Signed)
Can we try lo-estrin/Junel 1-20 instead? thanks

## 2020-08-30 ENCOUNTER — Ambulatory Visit: Payer: Self-pay | Admitting: Obstetrics and Gynecology

## 2020-08-30 DIAGNOSIS — Z0289 Encounter for other administrative examinations: Secondary | ICD-10-CM

## 2021-01-07 ENCOUNTER — Ambulatory Visit (HOSPITAL_COMMUNITY)
Admission: EM | Admit: 2021-01-07 | Discharge: 2021-01-07 | Disposition: A | Discharge status: Home / Self Care | Payer: 59

## 2021-01-07 ENCOUNTER — Other Ambulatory Visit: Payer: Self-pay

## 2021-01-07 ENCOUNTER — Encounter (HOSPITAL_COMMUNITY): Payer: Self-pay

## 2021-01-07 DIAGNOSIS — M5441 Lumbago with sciatica, right side: Secondary | ICD-10-CM

## 2021-01-07 DIAGNOSIS — M5442 Lumbago with sciatica, left side: Secondary | ICD-10-CM | POA: Diagnosis not present

## 2021-01-07 DIAGNOSIS — G8929 Other chronic pain: Secondary | ICD-10-CM

## 2021-01-07 MED ORDER — METHOCARBAMOL 500 MG PO TABS: 500.0000 mg | ORAL_TABLET | Freq: Two times a day (BID) | ORAL | 0 refills | Status: DC

## 2021-01-07 MED ORDER — IBUPROFEN 800 MG PO TABS: 800.0000 mg | ORAL_TABLET | Freq: Three times a day (TID) | ORAL | 0 refills | Status: DC | PRN

## 2021-01-07 NOTE — ED Provider Notes (Signed)
[facility]    CSN: [id number] Arrival date & time: 01/07/21  1107      History   Chief Complaint Chief Complaint  Patient presents with  . Back Pain    HPI Jill Riley is a 35 y.o. female.   Patient presents with low back pain intermittently x5 years.  She states she recently started a new job and is on her feet most of the time; She states this is causing her back pain to worsen.  She states the pain radiates to both legs and sometimes causes numbness in her feet.  She has been taking Flexeril but states this causes drowsiness so she is unable to take it daily.  She denies lower extremity weakness, loss of bowel/bladder control, saddle anesthesia, abdominal pain, dysuria, or other symptoms.  Her medical history includes asthma, kidney stones, ureteral stent.  The history is provided by the patient. A language interpreter was used.    Past Medical History:  Diagnosis Date  . Asthma   . Dysuria   . Flank pain   . History of kidney stones   . Left ureteral calculus    residual stone s/p  1st stage laser lithotripsy 07-15-2017  . Retained ureteral stent    right side  . Wears glasses     There are no problems to display for this patient.   Past Surgical History:  Procedure Laterality Date  . CYSTOSCOPY W/ URETERAL STENT REMOVAL Bilateral 07/29/2017   Procedure: CYSTOSCOPY WITH STENT REMOVAL;  Surgeon: Sebastian Ache, MD;  Location: [facility];  Service: Urology;  Laterality: Bilateral;  . CYSTOSCOPY WITH RETROGRADE PYELOGRAM, URETEROSCOPY AND STENT PLACEMENT Bilateral 07/15/2017   Procedure: CYSTOSCOPY WITH bilateral RETROGRADE PYELOGRAM, right URETEROSCOPY, left diagnostic ureteroscopy AND STENT PLACEMENT;  Surgeon: Sebastian Ache, MD;  Location: [facility];  Service: Urology;  Laterality: Bilateral;  . CYSTOSCOPY WITH RETROGRADE PYELOGRAM, URETEROSCOPY AND STENT PLACEMENT Left 07/29/2017   Procedure: CYSTOSCOPY  WITH RETROGRADE PYELOGRAM, URETEROSCOPY AND STENT REPLACEMENT LEFT URETER;  Surgeon: Sebastian Ache, MD;  Location: [facility];  Service: Urology;  Laterality: Left;  . HOLMIUM LASER APPLICATION Bilateral 07/15/2017   Procedure: HOLMIUM LASER APPLICATION;  Surgeon: Sebastian Ache, MD;  Location: [facility];  Service: Urology;  Laterality: Bilateral;  . HOLMIUM LASER APPLICATION Left 07/29/2017   Procedure: HOLMIUM LASER APPLICATION;  Surgeon: Sebastian Ache, MD;  Location: [facility];  Service: Urology;  Laterality: Left;  . TENDON REPAIR Left 04/21/2017   Procedure: LEFT THUMB REPAIR EXTENSOR  TENDON;  Surgeon: Betha Loa, MD;  Location: [facility];  Service: Orthopedics;  Laterality: Left;  . TUBAL LIGATION Bilateral 2013   [place]   "had procedure to not have children any more"    OB History    Gravida  6   Para  4   Term      Preterm      AB  2   Living  4     SAB  2   IAB      Ectopic      Multiple      Live Births               Home Medications    Prior to Admission medications   Medication Sig Start Date End Date Taking? Authorizing Provider  cyclobenzaprine (FLEXERIL) 10 MG tablet Take 10 mg by mouth 3 (three) times daily as needed for muscle spasms.  Yes [provider]  ibuprofen (ADVIL) 800 MG tablet Take 1 tablet (800 mg total) by mouth every 8 (eight) hours as needed. 01/07/21  Yes Mickie Bail, NP  methocarbamol (ROBAXIN) 500 MG tablet Take 1 tablet (500 mg total) by mouth 2 (two) times daily. 01/07/21  Yes Mickie Bail, NP  megestrol (MEGACE) 40 MG tablet Take 1 tablet (40 mg total) by mouth daily. 05/21/20   Dartha Lodge, PA-C  norethindrone-ethinyl estradiol (LOESTRIN FE) 1-20 MG-MCG tablet Take 1 tablet by mouth daily. 07/25/20   Theresia Majors, MD  norgestimate-ethinyl estradiol (ORTHO-CYCLEN) 0.25-35 MG-MCG tablet Take 1 tablet by mouth daily. 05/29/20   Theresia Majors,  MD  famotidine (PEPCID) 20 MG tablet Take 1 tablet (20 mg total) by mouth 2 (two) times daily. Patient not taking: No sig reported 08/30/19 01/07/21  Hedges, Tinnie Gens, PA-C  omeprazole (PRILOSEC) 20 MG capsule Take 1 capsule (20 mg total) by mouth daily. Patient not taking: No sig reported 08/30/19 01/07/21  Eyvonne Mechanic, PA-C    Family History Family History  Problem Relation Age of Onset  . Hypertension Mother     Social History Social History   Tobacco Use  . Smoking status: Never Smoker  . Smokeless tobacco: Never Used  Vaping Use  . Vaping Use: Never used  Substance Use Topics  . Alcohol use: Yes    Comment: Rare  . Drug use: No     Allergies   Patient has no known allergies.   Review of Systems Review of Systems  Constitutional: Negative for chills and fever.  HENT: Negative for ear pain and sore throat.   Eyes: Negative for pain and visual disturbance.  Respiratory: Negative for cough and shortness of breath.   Cardiovascular: Negative for chest pain and palpitations.  Gastrointestinal: Negative for abdominal pain and vomiting.  Genitourinary: Negative for dysuria and hematuria.  Musculoskeletal: Positive for back pain. Negative for arthralgias.  Skin: Negative for color change and rash.  Neurological: Positive for numbness. Negative for seizures, syncope and weakness.  All other systems reviewed and are negative.    Physical Exam Triage Vital Signs ED Triage Vitals [01/07/21 1245]  Enc Vitals Group     BP      Pulse      Resp      Temp      Temp src      SpO2      Weight      Height      Head Circumference      Peak Flow      Pain Score 8     Pain Loc      Pain Edu?      Excl. in GC?    No data found.  Updated Vital Signs BP 133/81 (BP Location: Right Arm)   Pulse 87   Temp 98.5 F (36.9 C) (Oral)   Resp 18   LMP  (Within Months) Comment: 1 month  SpO2 100%   Visual Acuity Right Eye Distance:   Left Eye Distance:   Bilateral  Distance:    Right Eye Near:   Left Eye Near:    Bilateral Near:     Physical Exam Vitals and nursing note reviewed.  Constitutional:      General: She is not in acute distress.    Appearance: She is well-developed and well-nourished. She is not ill-appearing.  HENT:     Head: Normocephalic and atraumatic.     Mouth/Throat:  Mouth: Mucous membranes are moist.  Eyes:     Conjunctiva/sclera: Conjunctivae normal.  Cardiovascular:     Rate and Rhythm: Normal rate and regular rhythm.     Heart sounds: Normal heart sounds.  Pulmonary:     Effort: Pulmonary effort is normal. No respiratory distress.     Breath sounds: Normal breath sounds.  Abdominal:     Palpations: Abdomen is soft.     Tenderness: There is no abdominal tenderness. There is no right CVA tenderness, left CVA tenderness, guarding or rebound.  Musculoskeletal:        General: No swelling, tenderness, deformity, signs of injury or edema. Normal range of motion.     Cervical back: Neck supple.  Skin:    General: Skin is warm and dry.     Findings: No bruising, erythema, lesion or rash.  Neurological:     General: No focal deficit present.     Mental Status: She is alert and oriented to person, place, and time.     Sensory: No sensory deficit.     Motor: No weakness.     Gait: Gait normal.     Comments: Negative straight leg raise.  Psychiatric:        Mood and Affect: Mood and affect and mood normal.        Behavior: Behavior normal.      UC Treatments / Results  Labs (all labs ordered are listed, but only abnormal results are displayed) Labs Reviewed - No data to display  EKG   Radiology No results found.  Procedures Procedures (including critical care time)  Medications Ordered in UC Medications - No data to display  Initial Impression / Assessment and Plan / UC Course  I have reviewed the triage vital signs and the nursing notes.  Pertinent labs & imaging results that were available during  my care of the patient were reviewed by me and considered in my medical decision making (see chart for details).   Chronic bilateral low back pain with bilateral sciatica.  Treating with ibuprofen and Robaxin.  Precautions for drowsiness with Robaxin discussed.  Instructed patient to follow-up with her PCP or an orthopedist if her symptoms are not improving.  She agrees to plan of care.   Final Clinical Impressions(s) / UC Diagnoses   Final diagnoses:  Chronic bilateral low back pain with bilateral sciatica     Discharge Instructions     Take the ibuprofen as needed for your pain.  Take the muscle relaxer as needed for muscle spasm; Do not drive, operate machinery, or drink alcohol with this medication as it may make you drowsy.    Follow up with your primary care provider or an orthopedist if your pain is not improving.          ED Prescriptions    Medication Sig Dispense Auth. Provider   ibuprofen (ADVIL) 800 MG tablet Take 1 tablet (800 mg total) by mouth every 8 (eight) hours as needed. 21 tablet Mickie Bail, NP   methocarbamol (ROBAXIN) 500 MG tablet Take 1 tablet (500 mg total) by mouth 2 (two) times daily. 20 tablet Mickie Bail, NP     PDMP not reviewed this encounter.   Mickie Bail, NP 01/07/21 1329

## 2021-01-07 NOTE — ED Triage Notes (Signed)
Pt presents with lower back pain x 5 years. 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 Pain radiates to the legs. States sometimes she feels her legs are lock, and cannot walk. Flexeril gives relief.   Pt wants needs to find a PCP.

## 2021-01-07 NOTE — Discharge Instructions (Signed)
Take the ibuprofen as needed for your pain.  Take the muscle relaxer as needed for muscle spasm; Do not drive, operate machinery, or drink alcohol with this medication as it may make you drowsy.    Follow up with your primary care provider or an orthopedist if your pain is not improving.

## 2021-03-20 ENCOUNTER — Ambulatory Visit (HOSPITAL_COMMUNITY)
Admission: EM | Admit: 2021-03-20 | Discharge: 2021-03-20 | Disposition: A | Discharge status: Home / Self Care | Payer: 59 | Attending: Family Medicine | Admitting: Family Medicine

## 2021-03-20 ENCOUNTER — Encounter (HOSPITAL_COMMUNITY): Payer: Self-pay

## 2021-03-20 ENCOUNTER — Other Ambulatory Visit: Payer: Self-pay

## 2021-03-20 DIAGNOSIS — H60391 Other infective otitis externa, right ear: Secondary | ICD-10-CM | POA: Diagnosis not present

## 2021-03-20 MED ORDER — AMOXICILLIN-POT CLAVULANATE 875-125 MG PO TABS: 1.0000 | ORAL_TABLET | Freq: Two times a day (BID) | ORAL | 0 refills | Status: DC

## 2021-03-20 NOTE — ED Triage Notes (Signed)
Pt in with c/o right ear pain that has been going on for a few weeks  States the pain started on 3/26 in her left ear and now her right ear and jaw hurt  Pt has been using ear drops with no relief

## 2021-03-20 NOTE — ED Provider Notes (Signed)
[facility]    CSN: [id number] Arrival date & time: 03/20/21  1346      History   Chief Complaint Chief Complaint  Patient presents with  . Otalgia    HPI Jill Riley is a 34 y.o. female.   Patient presenting today with several week history of initially left ear canal pain, swelling, headache which has improved some and now mostly symptoms are on the right side.  She states it is radiating upward toward temple and downward toward jaw at this point.  She denies fever, chills, drainage from the ear, hearing deficit, dizziness.  Has been trying over-the-counter eardrops with no benefit.  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     Past Medical History:  Diagnosis Date  . Asthma   . Dysuria   . Flank pain   . History of kidney stones   . Left ureteral calculus    residual stone s/p  1st stage laser lithotripsy 07-15-2017  . Retained ureteral stent    right side  . Wears glasses     There are no problems to display for this patient.   Past Surgical History:  Procedure Laterality Date  . CYSTOSCOPY W/ URETERAL STENT REMOVAL Bilateral 07/29/2017   Procedure: CYSTOSCOPY WITH STENT REMOVAL;  Surgeon: Sebastian Ache, MD;  Location: [facility];  Service: Urology;  Laterality: Bilateral;  . CYSTOSCOPY WITH RETROGRADE PYELOGRAM, URETEROSCOPY AND STENT PLACEMENT Bilateral 07/15/2017   Procedure: CYSTOSCOPY WITH bilateral RETROGRADE PYELOGRAM, right URETEROSCOPY, left diagnostic ureteroscopy AND STENT PLACEMENT;  Surgeon: Sebastian Ache, MD;  Location: [facility];  Service: Urology;  Laterality: Bilateral;  . CYSTOSCOPY WITH RETROGRADE PYELOGRAM, URETEROSCOPY AND STENT PLACEMENT Left 07/29/2017   Procedure: CYSTOSCOPY WITH RETROGRADE PYELOGRAM, URETEROSCOPY AND STENT REPLACEMENT LEFT URETER;  Surgeon: Sebastian Ache, MD;  Location: [facility];   Service: Urology;  Laterality: Left;  . HOLMIUM LASER APPLICATION Bilateral 07/15/2017   Procedure: HOLMIUM LASER APPLICATION;  Surgeon: Sebastian Ache, MD;  Location: [facility];  Service: Urology;  Laterality: Bilateral;  . HOLMIUM LASER APPLICATION Left 07/29/2017   Procedure: HOLMIUM LASER APPLICATION;  Surgeon: Sebastian Ache, MD;  Location: [facility];  Service: Urology;  Laterality: Left;  . TENDON REPAIR Left 04/21/2017   Procedure: LEFT THUMB REPAIR EXTENSOR  TENDON;  Surgeon: Betha Loa, MD;  Location: [facility];  Service: Orthopedics;  Laterality: Left;  . TUBAL LIGATION Bilateral 2013   [place]   "had procedure to not have children any more"    OB History    Gravida  6   Para  4   Term      Preterm      AB  2   Living  4     SAB  2   IAB      Ectopic      Multiple      Live Births               Home Medications    Prior to Admission medications   Medication Sig Start Date End Date Taking? Authorizing Provider  amoxicillin-clavulanate (AUGMENTIN) 875-125 MG tablet Take 1 tablet by mouth every 12 (twelve) hours. 03/20/21  Yes Particia Nearing, PA-C  cyclobenzaprine (FLEXERIL) 10 MG tablet Take 10 mg by mouth 3 (three) times daily as needed for muscle spasms.    [provider]  ibuprofen (ADVIL)  800 MG tablet Take 1 tablet (800 mg total) by mouth every 8 (eight) hours as needed. 01/07/21   Mickie Bail, NP  megestrol (MEGACE) 40 MG tablet Take 1 tablet (40 mg total) by mouth daily. 05/21/20   Dartha Lodge, PA-C  methocarbamol (ROBAXIN) 500 MG tablet Take 1 tablet (500 mg total) by mouth 2 (two) times daily. 01/07/21   Mickie Bail, NP  norethindrone-ethinyl estradiol (LOESTRIN FE) 1-20 MG-MCG tablet Take 1 tablet by mouth daily. 07/25/20   Theresia Majors, MD  norgestimate-ethinyl estradiol (ORTHO-CYCLEN) 0.25-35 MG-MCG tablet Take 1 tablet by mouth daily. 05/29/20   Theresia Majors, MD   famotidine (PEPCID) 20 MG tablet Take 1 tablet (20 mg total) by mouth 2 (two) times daily. Patient not taking: No sig reported 08/30/19 01/07/21  Hedges, Tinnie Gens, PA-C  omeprazole (PRILOSEC) 20 MG capsule Take 1 capsule (20 mg total) by mouth daily. Patient not taking: No sig reported 08/30/19 01/07/21  Eyvonne Mechanic, PA-C    Family History Family History  Problem Relation Age of Onset  . Hypertension Mother     Social History Social History   Tobacco Use  . Smoking status: Never Smoker  . Smokeless tobacco: Never Used  Vaping Use  . Vaping Use: Never used  Substance Use Topics  . Alcohol use: Yes    Comment: Rare  . Drug use: No     Allergies   Patient has no known allergies.   Review of Systems Review of Systems Per HPI  Physical Exam Triage Vital Signs ED Triage Vitals  Enc Vitals Group     BP 03/20/21 1356 136/84     Pulse Rate 03/20/21 1356 (!) 104     Resp 03/20/21 1356 18     Temp 03/20/21 1356 98.9 F (37.2 C)     Temp src --      SpO2 03/20/21 1356 99 %     Weight --      Height --      Head Circumference --      Peak Flow --      Pain Score 03/20/21 1354 10     Pain Loc --      Pain Edu? --      Excl. in GC? --    No data found.  Updated Vital Signs BP 136/84   Pulse (!) 104   Temp 98.9 F (37.2 C)   Resp 18   LMP 03/03/2021 (Approximate)   SpO2 99%   Visual Acuity Right Eye Distance:   Left Eye Distance:   Bilateral Distance:    Right Eye Near:   Left Eye Near:    Bilateral Near:     Physical Exam Vitals and nursing note reviewed.  Constitutional:      Appearance: Normal appearance. She is not ill-appearing.  HENT:     Head: Atraumatic.     Ears:     Comments: Bilateral ear canals erythematous, flaking, edematous right significantly worse than left.  Some dried drainage within the right ear canal.  Tender to palpation of auricle on the right.  Bilateral TMs appear benign    Nose: Nose normal.     Mouth/Throat:     Mouth:  Mucous membranes are moist.     Pharynx: Oropharynx is clear.  Eyes:     Extraocular Movements: Extraocular movements intact.     Conjunctiva/sclera: Conjunctivae normal.  Cardiovascular:     Rate and Rhythm: Normal rate and regular rhythm.  Heart sounds: Normal heart sounds.  Pulmonary:     Effort: Pulmonary effort is normal.     Breath sounds: Normal breath sounds.  Musculoskeletal:        General: Normal range of motion.     Cervical back: Normal range of motion and neck supple.  Skin:    General: Skin is warm and dry.  Neurological:     Mental Status: She is alert and oriented to person, place, and time.  Psychiatric:        Mood and Affect: Mood normal.        Thought Content: Thought content normal.        Judgment: Judgment normal.    UC Treatments / Results  Labs (all labs ordered are listed, but only abnormal results are displayed) Labs Reviewed - No data to display  EKG  Radiology No results found.  Procedures Procedures (including critical care time)  Medications Ordered in UC Medications - No data to display  Initial Impression / Assessment and Plan / UC Course  I have reviewed the triage vital signs and the nursing notes.  Pertinent labs & imaging results that were available during my care of the patient were reviewed by me and considered in my medical decision making (see chart for details).     We will treat with Augmentin given extent and duration of symptoms.  Discussed over-the-counter swimmer's ear drops to help dry out canals after swimming.  Over-the-counter pain relievers as needed additionally.  Return for acutely worsening symptoms or not resolving symptoms  Final Clinical Impressions(s) / UC Diagnoses   Final diagnoses:  Other infective acute otitis externa of right ear   Discharge Instructions   None    ED Prescriptions    Medication Sig Dispense Auth. Provider   amoxicillin-clavulanate (AUGMENTIN) 875-125 MG tablet Take 1  tablet by mouth every 12 (twelve) hours. 14 tablet Particia Nearing, [place]     PDMP not reviewed this encounter.   Roosvelt Maser [place], [place] 03/22/21 [phone]

## 2022-01-20 ENCOUNTER — Other Ambulatory Visit: Payer: Self-pay

## 2022-01-20 ENCOUNTER — Encounter (HOSPITAL_COMMUNITY): Payer: Self-pay

## 2022-01-20 ENCOUNTER — Emergency Department (HOSPITAL_COMMUNITY)
Admission: EM | Admit: 2022-01-20 | Discharge: 2022-01-20 | Disposition: A | Discharge status: Home / Self Care | Payer: Self-pay | Attending: Emergency Medicine | Admitting: Emergency Medicine

## 2022-01-20 DIAGNOSIS — M5441 Lumbago with sciatica, right side: Secondary | ICD-10-CM | POA: Insufficient documentation

## 2022-01-20 DIAGNOSIS — R000 Tachycardia, unspecified: Secondary | ICD-10-CM | POA: Insufficient documentation

## 2022-01-20 DIAGNOSIS — M5431 Sciatica, right side: Secondary | ICD-10-CM

## 2022-01-20 LAB — URINALYSIS, ROUTINE W REFLEX MICROSCOPIC
Bilirubin Urine: NEGATIVE
Glucose, UA: NEGATIVE mg/dL
Ketones, ur: NEGATIVE mg/dL
Nitrite: NEGATIVE
Protein, ur: NEGATIVE mg/dL
Specific Gravity, Urine: >1.03 — ABNORMAL HIGH (ref 1.005–1.030)
pH: 6 (ref 5.0–8.0)

## 2022-01-20 LAB — URINALYSIS, MICROSCOPIC (REFLEX)

## 2022-01-20 LAB — PREGNANCY, URINE: Preg Test, Ur: NEGATIVE

## 2022-01-20 MED ORDER — KETOROLAC TROMETHAMINE 30 MG/ML IJ SOLN
30.0000 mg | Freq: Once | INTRAMUSCULAR | Status: AC
  Administered 2022-01-20: 30 mg via INTRAMUSCULAR
  Filled 2022-01-20: qty 1

## 2022-01-20 MED ORDER — CYCLOBENZAPRINE HCL 10 MG PO TABS: 10.0000 mg | ORAL_TABLET | Freq: Two times a day (BID) | ORAL | 0 refills | Status: DC | PRN

## 2022-01-20 NOTE — ED Provider Triage Note (Signed)
Emergency Medicine Provider Triage Evaluation Note  Jill Riley , a 36 y.o. female  was evaluated in triage.  Pt complains of pain in the left side of her back. 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  She says this feels different than her history of kidney stones.  No fevers, nausea or vomiting, No urinary symptoms.    Physical Exam  BP (!) 125/97 (BP Location: Right Arm)    Pulse (!) 109    Temp 98.4 F (36.9 C) (Oral)    Resp 18    SpO2 99%  Gen:   Awake, no distress   Resp:  Normal effort  MSK:   Moves extremities without difficulty  Other:  Pain with palpation over the lateral right lower back/upper buttock   Medical Decision Making  Medically screening exam initiated at 1:20 PM.  Appropriate orders placed.  Jill Riley was informed that the remainder of the evaluation will be completed by another provider, this initial triage assessment does not replace that evaluation, and the importance of remaining in the ED until their evaluation is complete.  Note: Portions of this report may have been transcribed using voice recognition software. Every effort was made to ensure accuracy; however, inadvertent computerized transcription errors may be present    Cristina Gong, PA-C 01/20/22 1334

## 2022-01-20 NOTE — ED Triage Notes (Signed)
Pt arrives POV for eval of R sided lower back pain. Pt reports onset of pain yesterday. States she has a hx of this back pain, she believes d/t sweeping yesterday. States similar to previous episodes of back pain d/t sweeping. Hx of kidney stones, states this pain is different

## 2022-01-20 NOTE — Discharge Instructions (Addendum)
You can take Tylenol 1000 mg and ibuprofen 600 mg together every 6 hours for pain as needed. Use heat pad for muscle spasm. You can try flexeril for severe muscle spasm however it will make you sleepy so no driving. Return for weakness in leg, difficulty peeing or new concerns.

## 2022-01-20 NOTE — ED Notes (Signed)
Pt states the pain hurts when she does intense movement her lower right back lock up and she is unable to been all the way down. Pt feel her upper body doesn't coordinate with her lower body when in pain. Pt states she has been dealing with this awhile. She is usually prescribed medications or shot that last a few days but the pain comes back. Pt doesn't have a PCP but will f/u with one if recommended.

## 2022-01-20 NOTE — ED Provider Notes (Addendum)
[facility] EMERGENCY DEPARTMENT Provider Note   CSN: [id number] Arrival date & time: 01/20/22  1250     History  Chief Complaint  Patient presents with   Back Pain    Jill Riley is a 36 y.o. female.  Patient presents with right lower back pain with radiation down buttocks area.  Patient is at this in the past multiple times.  Patient has had recurrent pain getting worse since yesterday that started after sweeping/rotation movement.  Patient does not have a current primary doctor.  Patient has history of kidney stones but this feels different.  This feels like her sciatica/back pain.  Patient has no urinary symptoms, no vomiting, no fevers.  No difficulty with urination, no weakness or numbness in the lower extremities.      Home Medications Prior to Admission medications   Medication Sig Start Date End Date Taking? Authorizing Provider  cyclobenzaprine (FLEXERIL) 10 MG tablet Take 1 tablet (10 mg total) by mouth 2 (two) times daily as needed for muscle spasms. 01/20/22  Yes Elnora Morrison, MD  amoxicillin-clavulanate (AUGMENTIN) 875-125 MG tablet Take 1 tablet by mouth every 12 (twelve) hours. 03/20/21   Volney American, PA-C  cyclobenzaprine (FLEXERIL) 10 MG tablet Take 10 mg by mouth 3 (three) times daily as needed for muscle spasms.    [provider]  ibuprofen (ADVIL) 800 MG tablet Take 1 tablet (800 mg total) by mouth every 8 (eight) hours as needed. 01/07/21   Sharion Balloon, NP  megestrol (MEGACE) 40 MG tablet Take 1 tablet (40 mg total) by mouth daily. 05/21/20   Jacqlyn Larsen, PA-C  methocarbamol (ROBAXIN) 500 MG tablet Take 1 tablet (500 mg total) by mouth 2 (two) times daily. 01/07/21   Sharion Balloon, NP  norethindrone-ethinyl estradiol (LOESTRIN FE) 1-20 MG-MCG tablet Take 1 tablet by mouth daily. 07/25/20   Joseph Pierini, MD  norgestimate-ethinyl estradiol (ORTHO-CYCLEN) 0.25-35 MG-MCG tablet Take 1 tablet by mouth daily.  05/29/20   Joseph Pierini, MD  famotidine (PEPCID) 20 MG tablet Take 1 tablet (20 mg total) by mouth 2 (two) times daily. Patient not taking: No sig reported 08/30/19 01/07/21  Hedges, Dellis Filbert, PA-C  omeprazole (PRILOSEC) 20 MG capsule Take 1 capsule (20 mg total) by mouth daily. Patient not taking: No sig reported 08/30/19 01/07/21  Okey Regal, PA-C      Allergies    Patient has no known allergies.    Review of Systems   Review of Systems  Constitutional:  Negative for chills and fever.  HENT:  Negative for congestion.   Eyes:  Negative for visual disturbance.  Respiratory:  Negative for shortness of breath.   Cardiovascular:  Negative for chest pain.  Gastrointestinal:  Negative for abdominal pain and vomiting.  Genitourinary:  Negative for dysuria and flank pain.  Musculoskeletal:  Positive for back pain. Negative for neck pain and neck stiffness.  Skin:  Negative for rash.  Neurological:  Negative for light-headedness and headaches.   Physical Exam Updated Vital Signs BP (!) 125/97 (BP Location: Right Arm)    Pulse (!) 109    Temp 98.4 F (36.9 C) (Oral)    Resp 18    Ht 5\' 4"  (1.626 m)    Wt 93 kg    SpO2 99%    BMI 35.19 kg/m  Physical Exam Vitals and nursing note reviewed.  Constitutional:      General: She is not in acute distress.    Appearance: She  is well-developed.  HENT:     Head: Normocephalic and atraumatic.     Mouth/Throat:     Mouth: Mucous membranes are moist.  Eyes:     General:        Right eye: No discharge.        Left eye: No discharge.     Conjunctiva/sclera: Conjunctivae normal.  Neck:     Trachea: No tracheal deviation.  Cardiovascular:     Rate and Rhythm: Tachycardia present.     Heart sounds: No murmur heard. Pulmonary:     Effort: Pulmonary effort is normal.  Abdominal:     General: There is no distension.     Palpations: Abdomen is soft.     Tenderness: There is no abdominal tenderness.  Musculoskeletal:        General:  Tenderness present. No swelling.     Cervical back: Normal range of motion. No rigidity.     Comments: Patient has tenderness paraspinal on the right lumbar region and upper buttocks, no significant midline tenderness.  Patient has 5+ strength of flexion extension of ankles knees and hips.  Sensation intact to major nerves to palpation bilateral.  Equal reflexes 1+ bilateral lower extremities.  Skin:    General: Skin is warm.     Capillary Refill: Capillary refill takes less than 2 seconds.  Neurological:     General: No focal deficit present.     Mental Status: She is alert.  Psychiatric:        Mood and Affect: Mood normal.    ED Results / Procedures / Treatments   Labs (all labs ordered are listed, but only abnormal results are displayed) Labs Reviewed  URINALYSIS, ROUTINE W REFLEX MICROSCOPIC - Abnormal; Notable for the following components:      Result Value   APPearance HAZY (*)    Specific Gravity, Urine >1.030 (*)    Hgb urine dipstick LARGE (*)    Leukocytes,Ua SMALL (*)    All other components within normal limits  URINALYSIS, MICROSCOPIC (REFLEX) - Abnormal; Notable for the following components:   Bacteria, UA FEW (*)    All other components within normal limits  PREGNANCY, URINE    EKG None  Radiology No results found.  Procedures Procedures    Medications Ordered in ED Medications  ketorolac (TORADOL) 30 MG/ML injection 30 mg (has no administration in time range)    ED Course/ Medical Decision Making/ A&P                           Medical Decision Making Risk Prescription drug management.   Patient presents with clinical concern for musculoskeletal injury given that it started with rotational movement in patient's had this in the past.  Most likely sciatica, other muscular strain.  Other differentials include disc herniation, kidney stone, kidney infection, other.  Urinalysis reviewed showing hemoglobin no signs of infection.  Toradol ordered for pain  and intramuscular.  Work note given.  Close follow-up discussed and reasons to return discussed.  Considered CT scan however will hold at this time given mild pain, more likely sciatica, similar to previous and risk of radiation.          Final Clinical Impression(s) / ED Diagnoses Final diagnoses:  Sciatica of right side    Rx / DC Orders ED Discharge Orders          Ordered    cyclobenzaprine (FLEXERIL) 10 MG tablet  2 times daily  PRN        01/20/22 1710              Elnora Morrison, MD 01/20/22 Jamal Maes    Elnora Morrison, MD 01/20/22 1730

## 2022-01-22 LAB — URINE CULTURE

## 2022-06-30 ENCOUNTER — Emergency Department (HOSPITAL_COMMUNITY)
Admission: EM | Admit: 2022-06-30 | Discharge: 2022-06-30 | Disposition: A | Discharge status: Home / Self Care | Payer: Self-pay | Attending: Emergency Medicine | Admitting: Emergency Medicine

## 2022-06-30 ENCOUNTER — Emergency Department (HOSPITAL_COMMUNITY): Payer: Self-pay

## 2022-06-30 ENCOUNTER — Other Ambulatory Visit: Payer: Self-pay

## 2022-06-30 ENCOUNTER — Encounter (HOSPITAL_COMMUNITY): Payer: Self-pay | Admitting: Emergency Medicine

## 2022-06-30 DIAGNOSIS — R519 Headache, unspecified: Secondary | ICD-10-CM | POA: Insufficient documentation

## 2022-06-30 LAB — I-STAT BETA HCG BLOOD, ED (MC, WL, AP ONLY): I-stat hCG, quantitative: <5 m[IU]/mL (ref <5)

## 2022-06-30 LAB — CBC WITH DIFFERENTIAL/PLATELET
Abs Immature Granulocytes: 0.02 10*3/uL (ref 0.00–0.07)
Basophils Absolute: 0 10*3/uL (ref 0.0–0.1)
Basophils Relative: 1 %
Eosinophils Absolute: 0.1 10*3/uL (ref 0.0–0.5)
Eosinophils Relative: 1 %
HCT: 43.4 % (ref 36.0–46.0)
Hemoglobin: 15 g/dL (ref 12.0–15.0)
Immature Granulocytes: 0 %
Lymphocytes Relative: 31 %
Lymphs Abs: 2.6 10*3/uL (ref 0.7–4.0)
MCH: 31.6 pg (ref 26.0–34.0)
MCHC: 34.6 g/dL (ref 30.0–36.0)
MCV: 91.4 fL (ref 80.0–100.0)
Monocytes Absolute: 0.4 10*3/uL (ref 0.1–1.0)
Monocytes Relative: 5 %
Neutro Abs: 5.2 10*3/uL (ref 1.7–7.7)
Neutrophils Relative %: 62 %
Platelets: 356 10*3/uL (ref 150–400)
RBC: 4.75 MIL/uL (ref 3.87–5.11)
RDW: 11.9 % (ref 11.5–15.5)
WBC: 8.3 10*3/uL (ref 4.0–10.5)
nRBC: 0 % (ref 0.0–0.2)

## 2022-06-30 LAB — I-STAT CHEM 8, ED
BUN: 9 mg/dL (ref 6–20)
Calcium, Ion: 1.08 mmol/L — ABNORMAL LOW (ref 1.15–1.40)
Chloride: 105 mmol/L (ref 98–111)
Creatinine, Ser: 0.5 mg/dL (ref 0.44–1.00)
Glucose, Bld: 91 mg/dL (ref 70–99)
HCT: 45 % (ref 36.0–46.0)
Hemoglobin: 15.3 g/dL — ABNORMAL HIGH (ref 12.0–15.0)
Potassium: 4.9 mmol/L (ref 3.5–5.1)
Sodium: 136 mmol/L (ref 135–145)
TCO2: 26 mmol/L (ref 22–32)

## 2022-06-30 LAB — BASIC METABOLIC PANEL
Anion gap: 10 (ref 5–15)
BUN: 6 mg/dL (ref 6–20)
CO2: 23 mmol/L (ref 22–32)
Calcium: 9.4 mg/dL (ref 8.9–10.3)
Chloride: 104 mmol/L (ref 98–111)
Creatinine, Ser: 0.62 mg/dL (ref 0.44–1.00)
GFR, Estimated: >60 mL/min (ref >60)
Glucose, Bld: 96 mg/dL (ref 70–99)
Potassium: 4.7 mmol/L (ref 3.5–5.1)
Sodium: 137 mmol/L (ref 135–145)

## 2022-06-30 MED ORDER — RIZATRIPTAN BENZOATE 10 MG PO TABS: 10.0000 mg | ORAL_TABLET | ORAL | 0 refills | Status: DC | PRN

## 2022-06-30 MED ORDER — SUMATRIPTAN SUCCINATE 6 MG/0.5ML ~~LOC~~ SOLN
6.0000 mg | Freq: Once | SUBCUTANEOUS | Status: AC
Start: 2022-06-30 — End: 2022-06-30
  Administered 2022-06-30: 6 mg via SUBCUTANEOUS
  Filled 2022-06-30: qty 0.5

## 2022-06-30 MED ORDER — ONDANSETRON 4 MG PO TBDP: ORAL_TABLET | ORAL | 0 refills | Status: DC

## 2022-06-30 MED ORDER — ONDANSETRON 4 MG PO TBDP
4.0000 mg | ORAL_TABLET | Freq: Once | ORAL | Status: AC
  Administered 2022-06-30: 4 mg via ORAL
  Filled 2022-06-30: qty 1

## 2022-06-30 MED ORDER — IBUPROFEN 800 MG PO TABS
800.0000 mg | ORAL_TABLET | Freq: Once | ORAL | Status: AC
Start: 2022-06-30 — End: 2022-06-30
  Administered 2022-06-30: 800 mg via ORAL
  Filled 2022-06-30: qty 1

## 2022-06-30 MED ORDER — IOHEXOL 350 MG/ML SOLN
100.0000 mL | Freq: Once | INTRAVENOUS | Status: AC | PRN
  Administered 2022-06-30: 50 mL via INTRAVENOUS

## 2022-06-30 NOTE — ED Triage Notes (Signed)
Pt states she developed a headache into her right face that started yesterday around 2pm. Pt complains of right sided neck pain as well. Pt complains of feeling off balance, and having nausea.

## 2022-06-30 NOTE — ED Provider Triage Note (Signed)
Emergency Medicine Provider Triage Evaluation Note  Jill Riley , a 36 y.o. female  was evaluated in triage.  Pt complains of headache. Sudden onset sebver R sidedd head/ neck and face pain with associated vertigo and vomiting yesterday at 2:00 PM. No hx of headaches. No recent injuries  Review of Systems  Positive: HA Negative: Photophobia/ fever  Physical Exam  There were no vitals taken for this visit. Gen:   Awake, no distress   Resp:  Normal effort  MSK:   Moves extremities without difficulty  Other:  NO nystagmus. No rash  Medical Decision Making  Medically screening exam initiated at 9:47 AM.  Appropriate orders placed.  Monic Clydene Pugh Pagan was informed that the remainder of the evaluation will be completed by another provider, this initial triage assessment does not replace that evaluation, and the importance of remaining in the ED until their evaluation is complete.   Cta h/n r/o vert art dissection. No obvious evidence of zoster.   Arthor Captain, PA-C 06/30/22 [phone]

## 2022-06-30 NOTE — ED Notes (Signed)
Patient verbalizes understanding of discharge instructions. Opportunity for questioning and answers were provided. Armband removed by staff, pt discharged from ED. Pt ambulatory to ED waiting room. 

## 2022-06-30 NOTE — ED Provider Notes (Signed)
[facility] EMERGENCY DEPARTMENT Provider Note   CSN: [id number] Arrival date & time: 06/30/22  9622     History  Chief Complaint  Patient presents with   Headache    Jill Riley is a 36 y.o. female whom I saw in triage who presents with some headache, face pain, spinning sensation and nausea all on the right side.  No history of headaches.  She denies fever or chills.  No history of headaches.  Still continues to have the same headache, rates her pain at 7 out of 10   Headache      Home Medications Prior to Admission medications   Medication Sig Start Date End Date Taking? Authorizing Provider  ondansetron (ZOFRAN-ODT) 4 MG disintegrating tablet 4mg  ODT q4 hours prn nausea/vomit 06/30/22  Yes Tylan Briguglio, PA-C  amoxicillin-clavulanate (AUGMENTIN) 875-125 MG tablet Take 1 tablet by mouth every 12 (twelve) hours. 03/20/21   05/20/21, PA-C  cyclobenzaprine (FLEXERIL) 10 MG tablet Take 10 mg by mouth 3 (three) times daily as needed for muscle spasms.    [provider]  cyclobenzaprine (FLEXERIL) 10 MG tablet Take 1 tablet (10 mg total) by mouth 2 (two) times daily as needed for muscle spasms. 01/20/22   03/20/22, MD  ibuprofen (ADVIL) 800 MG tablet Take 1 tablet (800 mg total) by mouth every 8 (eight) hours as needed. 01/07/21   01/09/21, NP  megestrol (MEGACE) 40 MG tablet Take 1 tablet (40 mg total) by mouth daily. 05/21/20   07/21/20, PA-C  methocarbamol (ROBAXIN) 500 MG tablet Take 1 tablet (500 mg total) by mouth 2 (two) times daily. 01/07/21   01/09/21, NP  norethindrone-ethinyl estradiol (LOESTRIN FE) 1-20 MG-MCG tablet Take 1 tablet by mouth daily. 07/25/20   09/24/20, MD  norgestimate-ethinyl estradiol (ORTHO-CYCLEN) 0.25-35 MG-MCG tablet Take 1 tablet by mouth daily. 05/29/20   05/31/20, MD  rizatriptan (MAXALT) 10 MG tablet Take 1 tablet (10 mg total) by mouth as needed for migraine.  May repeat in 2 hours if needed 06/30/22   07/02/22, PA-C  famotidine (PEPCID) 20 MG tablet Take 1 tablet (20 mg total) by mouth 2 (two) times daily. Patient not taking: No sig reported 08/30/19 01/07/21  Hedges, 01/09/21, PA-C  omeprazole (PRILOSEC) 20 MG capsule Take 1 capsule (20 mg total) by mouth daily. Patient not taking: No sig reported 08/30/19 01/07/21  01/09/21, PA-C      Allergies    Patient has no known allergies.    Review of Systems   Review of Systems  Neurological:  Positive for headaches.    Physical Exam Updated Vital Signs BP (!) 133/98   Pulse 79   Temp 98.3 F (36.8 C) (Oral)   Resp 20   Ht 5\' 4"  (1.626 m)   Wt 93 kg   SpO2 100%   BMI 35.19 kg/m  Physical Exam Vitals and nursing note reviewed.  Constitutional:      General: She is not in acute distress.    Appearance: She is well-developed. She is not diaphoretic.  HENT:     Head: Normocephalic and atraumatic.     Right Ear: External ear normal.     Left Ear: External ear normal.     Nose: Nose normal.     Mouth/Throat:     Mouth: Mucous membranes are moist.  Eyes:     General: No scleral icterus.    Conjunctiva/sclera: Conjunctivae normal.  Pupils: Pupils are equal, round, and reactive to light.     Comments: No horizontal, vertical or rotational nystagmus  Neck:     Comments: Full active and passive ROM without pain No midline or paraspinal tenderness No nuchal rigidity or meningeal signs Cardiovascular:     Rate and Rhythm: Normal rate and regular rhythm.     Heart sounds: Normal heart sounds. No murmur heard.    No friction rub. No gallop.  Pulmonary:     Effort: Pulmonary effort is normal. No respiratory distress.     Breath sounds: Normal breath sounds. No wheezing or rales.  Abdominal:     General: Bowel sounds are normal. There is no distension.     Palpations: Abdomen is soft. There is no mass.     Tenderness: There is no abdominal tenderness. There is no guarding or  rebound.  Musculoskeletal:        General: Normal range of motion.     Cervical back: Normal range of motion and neck supple.  Lymphadenopathy:     Cervical: No cervical adenopathy.  Skin:    General: Skin is warm and dry.     Findings: No rash.  Neurological:     Mental Status: She is alert and oriented to person, place, and time.     Cranial Nerves: No cranial nerve deficit.     Motor: No abnormal muscle tone.     Coordination: Coordination normal.     Comments: Mental Status:  Alert, oriented, thought content appropriate. Speech fluent without evidence of aphasia. Able to follow 2 step commands without difficulty.  Cranial Nerves:  II:  Peripheral visual fields grossly normal, pupils equal, round, reactive to light III,IV, VI: ptosis not present, extra-ocular motions intact bilaterally  V,VII: smile symmetric, facial light touch sensation equal VIII: hearing grossly normal bilaterally  IX,X: midline uvula rise  XI: bilateral shoulder shrug equal and strong XII: midline tongue extension  Motor:  5/5 in upper and lower extremities bilaterally including strong and equal grip strength and dorsiflexion/plantar flexion Sensory: Pinprick and light touch normal in all extremities.  Cerebellar: normal finger-to-nose with bilateral upper extremities Gait: normal gait and balance CV: distal pulses palpable throughout   Psychiatric:        Behavior: Behavior normal.        Thought Content: Thought content normal.        Judgment: Judgment normal.     ED Results / Procedures / Treatments   Labs (all labs ordered are listed, but only abnormal results are displayed) Labs Reviewed  I-STAT CHEM 8, ED - Abnormal; Notable for the following components:      Result Value   Calcium, Ion 1.08 (*)    Hemoglobin 15.3 (*)    All other components within normal limits  BASIC METABOLIC PANEL  CBC WITH DIFFERENTIAL/PLATELET  I-STAT BETA HCG BLOOD, ED ([facility], [facility], [facility] ONLY)     EKG None  Radiology CT ANGIO HEAD NECK W WO CM  Result Date: 06/30/2022 CLINICAL DATA:  Dizziness EXAM: CT ANGIOGRAPHY HEAD AND NECK TECHNIQUE: Multidetector CT imaging of the head and neck was performed using the standard protocol during bolus administration of intravenous contrast. Multiplanar CT image reconstructions and MIPs were obtained to evaluate the vascular anatomy. Carotid stenosis measurements (when applicable) are obtained utilizing NASCET criteria, using the distal internal carotid diameter as the denominator. RADIATION DOSE REDUCTION: This exam was performed according to the departmental dose-optimization program which includes automated exposure control, adjustment of the  mA and/or kV according to patient size and/or use of iterative reconstruction technique. CONTRAST:  39mL OMNIPAQUE IOHEXOL 350 MG/ML SOLN COMPARISON:  None Available. FINDINGS: CT HEAD FINDINGS Brain: There is no acute intracranial hemorrhage, extra-axial fluid collection, or acute infarct. Parenchymal volume is normal. The ventricles are normal in size. Gray-white differentiation is preserved. There is no mass lesion.  There is no mass effect or midline shift. Vascular: See below. Skull: Normal. Negative for fracture or focal lesion. Sinuses: The paranasal sinuses are clear. Orbits: The globes and orbits are unremarkable. Review of the MIP images confirms the above findings CTA NECK FINDINGS Aortic arch: The imaged aortic arch is normal. The origins of the major branch vessels are patent. The subclavian arteries are patent to the level imaged. Right carotid system: The right common, internal, and external carotid arteries are patent, without hemodynamically significant stenosis or occlusion. There is no dissection or aneurysm. Left carotid system: The left common, internal, and external carotid arteries are patent, without hemodynamically significant stenosis or occlusion. There is no dissection or aneurysm. Vertebral  arteries: Vertebral arteries are patent, without hemodynamically significant stenosis or occlusion. There is no dissection or aneurysm. Skeleton: There is no acute osseous abnormality or suspicious osseous lesion. Other neck: The soft tissues of the neck are unremarkable. Upper chest: The imaged lung apices are clear. Review of the MIP images confirms the above findings CTA HEAD FINDINGS Anterior circulation: The intracranial ICAs are patent. The bilateral MCAs are patent The bilateral ACAs are patent. The anterior communicating artery is normal. There is no aneurysm or AVM. Posterior circulation: The bilateral V4 segments are patent. The major cerebellar artery origins are patent. The basilar artery is patent. The bilateral PCAs are patent. There is a fetal origin of the left PCA. There is no aneurysm or AVM. Venous sinuses: As permitted by contrast timing, patent. Anatomic variants: As above. Review of the MIP images confirms the above findings IMPRESSION: 1. Normal noncontrast head CT with no acute intracranial pathology. 2. Normal CTA of the head and neck. Electronically Signed   By: Lesia Hausen M.D.   On: 06/30/2022 11:33    Procedures Procedures    Medications Ordered in ED Medications  iohexol (OMNIPAQUE) 350 MG/ML injection 100 mL (50 mLs Intravenous Contrast Given 06/30/22 1106)  SUMAtriptan (IMITREX) injection 6 mg (6 mg Subcutaneous Given 06/30/22 1739)  ibuprofen (ADVIL) tablet 800 mg (800 mg Oral Given 06/30/22 1739)  ondansetron (ZOFRAN-ODT) disintegrating tablet 4 mg (4 mg Oral Given 06/30/22 1739)    ED Course/ Medical Decision Making/ A&P                           Medical Decision Making Jill Riley presents with headache Given the large differential diagnosis for Tashika Clydene Pugh Riley, the decision making in this case is of high complexity.  After evaluating all of the data points in this case, the presentation of Davie Clydene Pugh Riley is NOT  consistent with skull fracture, meningitis/encephalitis, SAH/sentinel bleed, Intracranial Hemorrhage (ICH) (subdural/epidural), acute obstructive hydrocephalus, space occupying lesions, CVA, CO Poisoning, Basilar/vertebral artery dissection, preeclampsia, cerebral venous thrombosis, hypertensive emergency, temporal Arteritis, Idiopathic Intracranial Hypertension (pseudotumor cerebri).  Strict return and follow-up precautions have been given by me personally or by detailed written instructions verbalized by nursing staff using the teach back method to patient/family/caregiver.  Data Reviewed/Counseling: I have reviewed the patient's vital signs, nursing notes, and other relevant tests/information. I had a detailed  discussion regarding the historical points, exam findings, and any diagnostic results supporting the discharge diagnosis. I also discussed the need for outpatient follow-up and the need to return to the ED if symptoms worsen or if there are any questions or concerns that arise at hom    Amount and/or Complexity of Data Reviewed Independent Historian: spouse Labs: ordered. Radiology: ordered.  Risk Prescription drug management.      Final Clinical Impression(s) / ED Diagnoses Final diagnoses:  Bad headache    Rx / DC Orders ED Discharge Orders          Ordered    ondansetron (ZOFRAN-ODT) 4 MG disintegrating tablet        06/30/22 1734    rizatriptan (MAXALT) 10 MG tablet  As needed,   Status:  Discontinued        06/30/22 1734    rizatriptan (MAXALT) 10 MG tablet  As needed        06/30/22 1735              Arthor Captain, PA-C 06/30/22 1903    Tegeler, Canary Brim, MD 06/30/22 [phone]

## 2022-06-30 NOTE — Discharge Instructions (Addendum)
If you continue to have bad headaches please follow-up with neurology or primary care doctor. Solicite ayuda inmediatamente si: La cefalea migraosa se vuelve cada vez ms intensa. La cefalea migraosa dura ms de 72 horas. Tiene fiebre. Presenta rigidez en el cuello. Presenta prdida de la visin. Siente debilidad en los msculos o que no puede controlarlos. Comienza a perder el equilibrio con frecuencia. Presenta dificultad para caminar. Se desmaya. Tiene una convulsin.

## 2022-08-04 ENCOUNTER — Encounter: Payer: Self-pay | Admitting: Neurology

## 2022-08-04 ENCOUNTER — Ambulatory Visit (INDEPENDENT_AMBULATORY_CARE_PROVIDER_SITE_OTHER): Payer: Self-pay | Admitting: Neurology

## 2022-08-04 ENCOUNTER — Telehealth: Payer: Self-pay | Admitting: Neurology

## 2022-08-04 DIAGNOSIS — Z91199 Patient's noncompliance with other medical treatment and regimen due to unspecified reason: Secondary | ICD-10-CM

## 2022-08-04 NOTE — Progress Notes (Signed)
Orville Govern no showed her new patient appointment today, interpreter waited 30 minutes for her.  She has an 22%% no-show rate in epic.  This is her first no-show at least here.  If patient calls back please have a talk with her, we have a lot of patients awaiting appointments and interpreters its really not fair to them when people just do not show up, please ask her not to make an appointment unless she intends on coming.  However, Something could have happened today and in that case we totally understand that we are happy to see her back but if she no-shows again she will be dismissed from our practice. If she calls to reschedule, send her back to new referrals thanks

## 2022-08-04 NOTE — Telephone Encounter (Signed)
Orville Govern no showed her new patient appointment today, interpreter waited 30 minutes for her.  She has an 22%% no-show rate in epic.  This is her first no-show at least here.  If patient calls back please have a talk with her, we have a lot of patients awaiting appointments and interpreters and its really not fair to them when people just do not show up, please ask her not to make an appointment unless she intends on coming. We even called her  at appointment time and she told us she was not coming. However, Something could have happened today and in that case we totally understand that we are happy to see her back but if she no-shows again she will be dismissed from our practice. If she calls to reschedule, send her back to new referrals do NOT reschedule until talking to new referrals  thanks

## 2023-12-11 ENCOUNTER — Other Ambulatory Visit: Payer: Self-pay

## 2023-12-11 ENCOUNTER — Encounter (HOSPITAL_COMMUNITY): Payer: Self-pay

## 2023-12-11 ENCOUNTER — Emergency Department (HOSPITAL_COMMUNITY): Payer: Medicaid Other

## 2023-12-11 ENCOUNTER — Emergency Department (HOSPITAL_BASED_OUTPATIENT_CLINIC_OR_DEPARTMENT_OTHER): Payer: Medicaid Other

## 2023-12-11 ENCOUNTER — Emergency Department (HOSPITAL_COMMUNITY)
Admission: EM | Admit: 2023-12-11 | Discharge: 2023-12-11 | Disposition: A | Discharge status: Home / Self Care | Payer: Medicaid Other | Attending: Emergency Medicine | Admitting: Emergency Medicine

## 2023-12-11 DIAGNOSIS — M542 Cervicalgia: Secondary | ICD-10-CM | POA: Insufficient documentation

## 2023-12-11 DIAGNOSIS — M7918 Myalgia, other site: Secondary | ICD-10-CM | POA: Insufficient documentation

## 2023-12-11 DIAGNOSIS — M25512 Pain in left shoulder: Secondary | ICD-10-CM | POA: Diagnosis present

## 2023-12-11 DIAGNOSIS — R000 Tachycardia, unspecified: Secondary | ICD-10-CM | POA: Diagnosis not present

## 2023-12-11 DIAGNOSIS — M7989 Other specified soft tissue disorders: Secondary | ICD-10-CM

## 2023-12-11 LAB — CBC
HCT: 43.4 % (ref 36.0–46.0)
Hemoglobin: 14.8 g/dL (ref 12.0–15.0)
MCH: 31.6 pg (ref 26.0–34.0)
MCHC: 34.1 g/dL (ref 30.0–36.0)
MCV: 92.7 fL (ref 80.0–100.0)
Platelets: 282 10*3/uL (ref 150–400)
RBC: 4.68 MIL/uL (ref 3.87–5.11)
RDW: 11.9 % (ref 11.5–15.5)
WBC: 9.2 10*3/uL (ref 4.0–10.5)
nRBC: 0 % (ref 0.0–0.2)

## 2023-12-11 LAB — BASIC METABOLIC PANEL
Anion gap: 10 (ref 5–15)
BUN: 11 mg/dL (ref 6–20)
CO2: 21 mmol/L — ABNORMAL LOW (ref 22–32)
Calcium: 9 mg/dL (ref 8.9–10.3)
Chloride: 104 mmol/L (ref 98–111)
Creatinine, Ser: 0.76 mg/dL (ref 0.44–1.00)
GFR, Estimated: >60 mL/min (ref >60)
Glucose, Bld: 139 mg/dL — ABNORMAL HIGH (ref 70–99)
Potassium: 3.9 mmol/L (ref 3.5–5.1)
Sodium: 135 mmol/L (ref 135–145)

## 2023-12-11 LAB — TROPONIN I (HIGH SENSITIVITY): Troponin I (High Sensitivity): 3 ng/L (ref <18)

## 2023-12-11 LAB — HCG, QUANTITATIVE, PREGNANCY: hCG, Beta Chain, Quant, S: <1 m[IU]/mL (ref <5)

## 2023-12-11 MED ORDER — CYCLOBENZAPRINE HCL 10 MG PO TABS: 10.0000 mg | ORAL_TABLET | Freq: Two times a day (BID) | ORAL | 0 refills | Status: AC | PRN

## 2023-12-11 MED ORDER — CYCLOBENZAPRINE HCL 10 MG PO TABS
5.0000 mg | ORAL_TABLET | Freq: Once | ORAL | Status: AC
  Administered 2023-12-11: 5 mg via ORAL
  Filled 2023-12-11: qty 1

## 2023-12-11 MED ORDER — LIDOCAINE 5 % EX PTCH
1.0000 | MEDICATED_PATCH | CUTANEOUS | Status: DC
  Administered 2023-12-11: 1 via TRANSDERMAL
  Filled 2023-12-11: qty 1

## 2023-12-11 MED ORDER — IBUPROFEN 400 MG PO TABS
600.0000 mg | ORAL_TABLET | Freq: Once | ORAL | Status: AC
  Administered 2023-12-11: 600 mg via ORAL
  Filled 2023-12-11: qty 1

## 2023-12-11 NOTE — Progress Notes (Signed)
Upper extremity venous duplex completed. Please see CV Procedures for preliminary results.  Shona Simpson, RVT 12/11/23 6:19 PM

## 2023-12-11 NOTE — ED Triage Notes (Signed)
Pt c/o L chest/shoulder pain for three days. Denies injury, SOB, N/V.

## 2023-12-11 NOTE — ED Provider Notes (Signed)
[place] EMERGENCY DEPARTMENT AT [facility] Provider Note   CSN: [id number] Arrival date & time: 12/11/23  1710     History  Chief Complaint  Patient presents with   Shoulder Pain   Chest Pain    Jill Riley is a 37 y.o. female with a history of asthma who presents the ED today for left shoulder pain.  Patient reports pain to the left upper chest and shoulder for the past 3 days.  She states that she moved her neck a certain way and her heard "crunch" prior to the onset of symptoms.  She states that the left side of her neck and shoulder feel tight and she has pain with movement.  She thinks the area is swollen as well.  Pain is worse with movement and to touch.  She has not taken anything to try to alleviate her symptoms. Denies difficulty swallowing, sore throat, arm or back pain.  Patient reports history of back spasms and states that she was given Flexeril in [place] which helped with her pain.  She states the pain that she has now feels similar to what it did back then. No additional complaints or concerns at this time.    Home Medications Prior to Admission medications   Medication Sig Start Date End Date Taking? Authorizing Provider  cyclobenzaprine (FLEXERIL) 10 MG tablet Take 1 tablet (10 mg total) by mouth 2 (two) times daily as needed for up to 7 days for muscle spasms. 12/11/23 12/18/23 Yes Maxwell Marion, PA-C  amoxicillin-clavulanate (AUGMENTIN) 875-125 MG tablet Take 1 tablet by mouth every 12 (twelve) hours. 03/20/21   Particia Nearing, PA-C  ibuprofen (ADVIL) 800 MG tablet Take 1 tablet (800 mg total) by mouth every 8 (eight) hours as needed. 01/07/21   Mickie Bail, NP  megestrol (MEGACE) 40 MG tablet Take 1 tablet (40 mg total) by mouth daily. 05/21/20   Rosezella Rumpf, PA-C  methocarbamol (ROBAXIN) 500 MG tablet Take 1 tablet (500 mg total) by mouth 2 (two) times daily. 01/07/21   Mickie Bail, NP  norethindrone-ethinyl estradiol  (LOESTRIN FE) 1-20 MG-MCG tablet Take 1 tablet by mouth daily. 07/25/20   Theresia Majors, MD  norgestimate-ethinyl estradiol (ORTHO-CYCLEN) 0.25-35 MG-MCG tablet Take 1 tablet by mouth daily. 05/29/20   Theresia Majors, MD  ondansetron (ZOFRAN-ODT) 4 MG disintegrating tablet 4mg  ODT q4 hours prn nausea/vomit 06/30/22   Harris, Cammy Copa, PA-C  rizatriptan (MAXALT) 10 MG tablet Take 1 tablet (10 mg total) by mouth as needed for migraine. May repeat in 2 hours if needed 06/30/22   Arthor Captain, PA-C  famotidine (PEPCID) 20 MG tablet Take 1 tablet (20 mg total) by mouth 2 (two) times daily. Patient not taking: No sig reported 08/30/19 01/07/21  Hedges, Tinnie Gens, PA-C  omeprazole (PRILOSEC) 20 MG capsule Take 1 capsule (20 mg total) by mouth daily. Patient not taking: No sig reported 08/30/19 01/07/21  Eyvonne Mechanic, PA-C      Allergies    Patient has no known allergies.    Review of Systems   Review of Systems  Musculoskeletal:  Positive for neck pain.  All other systems reviewed and are negative.   Physical Exam Updated Vital Signs BP (!) 136/98 (BP Location: Right Arm)   Pulse (!) 106   Temp 99.2 F (37.3 C) (Oral)   Resp 16   Ht 5\' 4"  (1.626 m)   Wt 93 kg   SpO2 99%   BMI 35.19 kg/m  Physical Exam Vitals and nursing note reviewed.  Constitutional:      General: She is not in acute distress.    Appearance: Normal appearance.  HENT:     Head: Normocephalic and atraumatic.     Mouth/Throat:     Mouth: Mucous membranes are moist.     Pharynx: Oropharynx is clear.  Eyes:     Conjunctiva/sclera: Conjunctivae normal.     Pupils: Pupils are equal, round, and reactive to light.  Neck:     Comments: No midline tenderness, step-off, or deformity. Tenderness to palpation of the left side of the neck to the left shoulder. No visible swelling. ROM intact but turning head to the neck limited second to pain. Cardiovascular:     Rate and Rhythm: Normal rate and regular rhythm.     Pulses:  Normal pulses.     Heart sounds: Normal heart sounds.  Pulmonary:     Effort: Pulmonary effort is normal.     Breath sounds: Normal breath sounds.  Abdominal:     Palpations: Abdomen is soft.     Tenderness: There is no abdominal tenderness.  Musculoskeletal:        General: Tenderness present. No swelling. Normal range of motion.     Cervical back: Neck supple. Tenderness present.     Comments: Tenderness to touch of the left side of the chest. No tenderness to thoracic or lumbar spine. ROM, strength, and sensation intact bilaterally.  Skin:    General: Skin is warm and dry.     Findings: No rash.  Neurological:     General: No focal deficit present.     Mental Status: She is alert.     Sensory: No sensory deficit.     Motor: No weakness.  Psychiatric:        Mood and Affect: Mood normal.        Behavior: Behavior normal.    ED Results / Procedures / Treatments   Labs (all labs ordered are listed, but only abnormal results are displayed) Labs Reviewed  BASIC METABOLIC PANEL - Abnormal; Notable for the following components:      Result Value   CO2 21 (*)    Glucose, Bld 139 (*)    All other components within normal limits  CBC  HCG, QUANTITATIVE, PREGNANCY  TROPONIN I (HIGH SENSITIVITY)    EKG None  Radiology UE VENOUS DUPLEX (7am - 7pm) Result Date: 12/11/2023 UPPER VENOUS STUDY  Patient Name:  Jill Riley  Date of Exam:   12/11/2023 Medical Rec #: [id number]                       Accession #:    [id number] Date of Birth: August 08, 1986                        Patient Gender: F Patient Age:   37 years Exam Location:  [facility] Procedure:      VAS [place] UPPER EXTREMITY VENOUS DUPLEX Referring Phys: SAVANNAH MEREDITH --------------------------------------------------------------------------------  Indications: Pain Risk Factors: Past pregnancy. Comparison Study: None. Performing Technologist: Shona Simpson  Examination Guidelines: A complete evaluation  includes B-mode imaging, spectral Doppler, color Doppler, and power Doppler as needed of all accessible portions of each vessel. Bilateral testing is considered an integral part of a complete examination. Limited examinations for reoccurring indications may be performed as noted.  Right Findings: +----------+------------+---------+-----------+----------+-------+ RIGHT     CompressiblePhasicitySpontaneousPropertiesSummary +----------+------------+---------+-----------+----------+-------+ Subclavian  Full       Yes       Yes                      +----------+------------+---------+-----------+----------+-------+  Left Findings: +----------+------------+---------+-----------+----------+-------+ LEFT      CompressiblePhasicitySpontaneousPropertiesSummary +----------+------------+---------+-----------+----------+-------+ IJV           Full       Yes       Yes                      +----------+------------+---------+-----------+----------+-------+ Subclavian    Full       Yes       Yes                      +----------+------------+---------+-----------+----------+-------+ Axillary      Full       Yes       Yes                      +----------+------------+---------+-----------+----------+-------+ Brachial      Full       Yes       Yes                      +----------+------------+---------+-----------+----------+-------+ Radial        Full       Yes       Yes                      +----------+------------+---------+-----------+----------+-------+ Ulnar         Full       Yes       Yes                      +----------+------------+---------+-----------+----------+-------+ Cephalic      Full                 Yes                      +----------+------------+---------+-----------+----------+-------+ Basilic       Full                 Yes                      +----------+------------+---------+-----------+----------+-------+  Summary:  Right: No  evidence of thrombosis in the subclavian.  Left: No evidence of deep vein thrombosis in the upper extremity. No evidence of superficial vein thrombosis in the upper extremity.  *See table(s) above for measurements and observations.     Preliminary    DG Chest 2 View Result Date: 12/11/2023 CLINICAL DATA:  Chest pain and nausea for 2 days. EXAM: CHEST - 2 VIEW COMPARISON:  12/27/2018 FINDINGS: The heart size and mediastinal contours are within normal limits. Both lungs are clear. The visualized skeletal structures are unremarkable. IMPRESSION: Normal exam. Electronically Signed   By: Danae Orleans M.D.   On: 12/11/2023 18:16    Procedures Procedures: not indicated.   Medications Ordered in ED Medications  lidocaine (LIDODERM) 5 % 1 patch (1 patch Transdermal Patch Applied 12/11/23 1954)  ibuprofen (ADVIL) tablet 600 mg (600 mg Oral Given 12/11/23 1954)  cyclobenzaprine (FLEXERIL) tablet 5 mg (5 mg Oral Given 12/11/23 1954)    ED Course/ Medical Decision Making/ A&P  Medical Decision Making Risk Prescription drug management.   This patient presents to the ED for concern of chest pain and left neck pain, this involves an extensive number of treatment options, and is a complaint that carries with it a high risk of complications and morbidity.   Differential diagnosis includes: ACS, pneumonia, pneumothorax, costochondritis, pleurisy, muscle spasm/strain, etc. Low suspicion for fracture since there's no midline pain or recent trauma.   Comorbidities  No significant past medical history   Additional History  Additional history obtained from prior records.   Cardiac Monitoring / EKG  The patient was maintained on a cardiac monitor.  I personally viewed and interpreted the cardiac monitored which showed: Sinus tachycardia with a heart rate of 103 bpm.   Lab Tests  I ordered and personally interpreted labs.  The pertinent results include:    Negative troponin BMP and CBC within normal limits - no acute electrolyte derangement, AKI, infection, or anemia Negative pregnancy test   Imaging Studies  I ordered imaging studies including CXR and left upper extremity ultrasound  I independently visualized and interpreted imaging which showed:  Normal chest x-ray No evidence of superficial or deep vein thrombosis of the upper extremity I agree with the radiologist interpretation   Problem List / ED Course / Critical Interventions / Medication Management  Left sided neck and chest pain x3 days I ordered medications including: Ibuprofen, lidocaine patch, and Flexeril for pain - given prior to discharge. I have reviewed the patients home medicines and have made adjustments as needed We discussed the possibility of imaging. Since there is no midline cervical spine back, posterior neck pain, or inability to move the neck, low suspicion for c-spine fracture. Patient also stated that she did not think neck imaging was needed. Discussed findings with patient and husband at bedside. All questions were answered. Ambulatory referral for a primary care provider sent for patient to establish care. Prescriptions for flexeril sent to the pharmacy.   Social Determinants of Health  Access to healthcare   Test / Admission - Considered  Patient is stable and safe for discharge home.  Return precautions given.        Final Clinical Impression(s) / ED Diagnoses Final diagnoses:  Musculoskeletal pain    Rx / DC Orders ED Discharge Orders          Ordered    Ambulatory referral to [facility]        12/11/23 1946    cyclobenzaprine (FLEXERIL) 10 MG tablet  2 times daily PRN        12/11/23 1946              Maxwell Marion, PA-C 12/11/23 2342    Rondel Baton, MD 12/12/23 1440

## 2023-12-11 NOTE — ED Provider Triage Note (Signed)
Emergency Medicine Provider Triage Evaluation Note  Jill Riley , a 37 y.o. female  was evaluated in triage.  Pt complains of shoulder pain.  Review of Systems  Positive:  Negative:   Physical Exam  BP (!) 136/98 (BP Location: Right Arm)   Pulse (!) 106   Temp 99.2 F (37.3 C) (Oral)   Resp 16   SpO2 99%  Gen:   Awake, no distress   Resp:  Normal effort  MSK:   Moves extremities without difficulty  Other:    Medical Decision Making  Medically screening exam initiated at 5:19 PM.  Appropriate orders placed.  Jill Riley was informed that the remainder of the evaluation will be completed by another provider, this initial triage assessment does not replace that evaluation, and the importance of remaining in the ED until their evaluation is complete.  Concerned for left shoulder/neck pain x2 days. Also endorsing swelling of this area which I am not appreciating on physical exam. Denies recent trauma or lifting heavy objects. Left lateral clavicular area tender to palpation.   Dorthy Cooler, [place] 12/11/23 1725

## 2023-12-11 NOTE — Discharge Instructions (Addendum)
Como ya hemos comentado, las imgenes y los anlisis de laboratorio son tranquilizadores. No hay signos de ataque cardaco ni lesin. El dolor de cuello probablemente se deba a una distensin o espasmo muscular. Alterne entre ibuprofeno y Tylenol cada 4 horas segn sea necesario, esto le ayudar con el dolor de cuello y [place]. Tome Flexeril ONEOK veces al da para el dolor irruptivo. Este medicamento tambin puede causar somnolencia, as que no conduzca despus de tomarlo. Puede conseguir parches de lidocana sin receta para un alivio adicional. He enviado una derivacin a un mdico de atencin primaria. El Multimedia programmer en los prximos das para programar una cita.   Solicite ayuda de inmediato si: Tiene una nueva lesin o el dolor empeora o es diferente. Tiene adormecimiento u hormigueo en la zona dolorida.

## 2024-03-28 ENCOUNTER — Other Ambulatory Visit (HOSPITAL_COMMUNITY)
Admission: RE | Admit: 2024-03-28 | Discharge: 2024-03-28 | Disposition: A | Discharge status: Home / Self Care | Source: Ambulatory Visit | Attending: Radiology | Admitting: Radiology

## 2024-03-28 ENCOUNTER — Encounter: Payer: Self-pay | Admitting: Radiology

## 2024-03-28 ENCOUNTER — Ambulatory Visit (INDEPENDENT_AMBULATORY_CARE_PROVIDER_SITE_OTHER): Admitting: Radiology

## 2024-03-28 VITALS — BP 112/68 | HR 77 | Ht 65.0 in | Wt 216.0 lb

## 2024-03-28 DIAGNOSIS — Z1331 Encounter for screening for depression: Secondary | ICD-10-CM | POA: Diagnosis not present

## 2024-03-28 DIAGNOSIS — Z01419 Encounter for gynecological examination (general) (routine) without abnormal findings: Secondary | ICD-10-CM

## 2024-03-28 DIAGNOSIS — L304 Erythema intertrigo: Secondary | ICD-10-CM | POA: Diagnosis not present

## 2024-03-28 DIAGNOSIS — N921 Excessive and frequent menstruation with irregular cycle: Secondary | ICD-10-CM

## 2024-03-28 MED ORDER — NORETHINDRONE 0.35 MG PO TABS: 1.0000 | ORAL_TABLET | Freq: Every day | ORAL | 4 refills | Status: DC

## 2024-03-28 MED ORDER — CLOTRIMAZOLE-BETAMETHASONE 1-0.05 % EX CREA: 1.0000 | TOPICAL_CREAM | Freq: Two times a day (BID) | CUTANEOUS | 0 refills | Status: DC

## 2024-03-28 NOTE — Progress Notes (Signed)
 Jill Riley 1986/10/16 [id number]   History:  38 y.o. accompanied by Spanish interpreter G6P4 presents for annual exam as a new patient. C/o heavy irregular periods, was previously on Micronor but ran out last year. BTL for [facility]. C/o rash under and between breasts.  Gynecologic History Patient's last menstrual period was 03/03/2024.   Contraception/Family planning: tubal ligation Sexually active: yes Last Pap: >5 years. Results were: normal   Obstetric History OB History  Gravida Para Term Preterm AB Living  6 4   2 4   SAB IAB Ectopic Multiple Live Births  2        # Outcome Date GA Lbr Len/2nd Weight Sex Type Anes PTL Lv  6 SAB           5 SAB           4 Para           3 Para           2 Para           1 Para                03/28/2024    2:33 PM  Depression screen PHQ 2/9  Decreased Interest 0  Down, Depressed, Hopeless 0  PHQ - 2 Score 0     The following portions of the patient's history were reviewed and updated as appropriate: allergies, current medications, past family history, past medical history, past social history, past surgical history, and problem list.  Review of Systems  All other systems reviewed and are negative.   Past medical history, past surgical history, family history and social history were all reviewed and documented in the EPIC chart.  Exam:  Vitals:   03/28/24 1422  BP: 112/68  Pulse: 77  SpO2: 100%  Weight: 216 lb (98 kg)  Height: 5\' 5"  (1.651 m)   Body mass index is 35.94 kg/m.  Physical Exam Vitals and nursing note reviewed. Exam conducted with a chaperone present.  Constitutional:      Appearance: Normal appearance. She is normal weight.  HENT:     Head: Normocephalic and atraumatic.  Neck:     Thyroid: No thyroid mass, thyromegaly or thyroid tenderness.  Cardiovascular:     Rate and Rhythm: Regular rhythm.     Heart sounds: Normal heart sounds.  Pulmonary:     Effort: Pulmonary effort is normal.      Breath sounds: Normal breath sounds.  Chest:  Breasts:    Breasts are symmetrical.     Right: Skin change present. No inverted nipple, mass, nipple discharge or tenderness.     Left: Skin change present. No inverted nipple, mass, nipple discharge or tenderness.     Comments: Intertrigo under and between breasts Abdominal:     General: Abdomen is flat. Bowel sounds are normal.     Palpations: Abdomen is soft.  Genitourinary:    General: Normal vulva.     Vagina: Normal. No vaginal discharge, bleeding or lesions.     Cervix: Normal. No discharge or lesion.     Uterus: Normal. Not enlarged and not tender.      Adnexa: Right adnexa normal and left adnexa normal.       Right: No mass, tenderness or fullness.         Left: No mass, tenderness or fullness.    Lymphadenopathy:     Upper Body:     Right upper body: No axillary adenopathy.  Left upper body: No axillary adenopathy.  Skin:    General: Skin is warm and dry.  Neurological:     Mental Status: She is alert and oriented to person, place, and time.  Psychiatric:        Mood and Affect: Mood normal.        Thought Content: Thought content normal.        Judgment: Judgment normal.      Ellis Guys, CMA present for exam  Assessment/Plan:   1. Well woman exam with routine gynecological exam (Primary) - Cytology - PAP( [facility])  2. Menometrorrhagia - norethindrone (ORTHO MICRONOR) 0.35 MG tablet; Take 1 tablet (0.35 mg total) by mouth daily.  Dispense: 84 tablet; Refill: 4  3. Intertrigo - clotrimazole-betamethasone (LOTRISONE) cream; Apply 1 Application topically 2 (two) times daily.  Dispense: 30 g; Refill: 0    Discussed SBE, colonoscopy and DEXA screening as directed/appropriate. Recommend of exercise weekly, including weight bearing exercise. Encouraged the use of seatbelts and sunscreen.  Return in about 1 year (around 03/28/2025) for Annual.  Jodelle Fausto B WHNP-BC 2:46 PM 03/28/2024

## 2024-03-28 NOTE — Patient Instructions (Signed)

## 2024-03-31 LAB — CYTOLOGY - PAP
Comment: NEGATIVE
Diagnosis: NEGATIVE
High risk HPV: NEGATIVE

## 2024-06-14 ENCOUNTER — Encounter (HOSPITAL_COMMUNITY): Payer: Self-pay | Admitting: Emergency Medicine

## 2024-06-14 ENCOUNTER — Emergency Department (HOSPITAL_COMMUNITY)

## 2024-06-14 ENCOUNTER — Emergency Department (HOSPITAL_COMMUNITY)
Admission: EM | Admit: 2024-06-14 | Discharge: 2024-06-14 | Disposition: A | Discharge status: Home / Self Care | Attending: Emergency Medicine | Admitting: Emergency Medicine

## 2024-06-14 ENCOUNTER — Other Ambulatory Visit: Payer: Self-pay

## 2024-06-14 DIAGNOSIS — N132 Hydronephrosis with renal and ureteral calculous obstruction: Secondary | ICD-10-CM | POA: Insufficient documentation

## 2024-06-14 DIAGNOSIS — R109 Unspecified abdominal pain: Secondary | ICD-10-CM | POA: Diagnosis present

## 2024-06-14 DIAGNOSIS — N200 Calculus of kidney: Secondary | ICD-10-CM

## 2024-06-14 LAB — CBC
HCT: 43.5 % (ref 36.0–46.0)
Hemoglobin: 14.7 g/dL (ref 12.0–15.0)
MCH: 31.6 pg (ref 26.0–34.0)
MCHC: 33.8 g/dL (ref 30.0–36.0)
MCV: 93.5 fL (ref 80.0–100.0)
Platelets: 285 10*3/uL (ref 150–400)
RBC: 4.65 MIL/uL (ref 3.87–5.11)
RDW: 11.9 % (ref 11.5–15.5)
WBC: 7.5 10*3/uL (ref 4.0–10.5)
nRBC: 0 % (ref 0.0–0.2)

## 2024-06-14 LAB — COMPREHENSIVE METABOLIC PANEL WITH GFR
ALT: 19 U/L (ref 0–44)
AST: 18 U/L (ref 15–41)
Albumin: 3.9 g/dL (ref 3.5–5.0)
Alkaline Phosphatase: 57 U/L (ref 38–126)
Anion gap: 10 (ref 5–15)
BUN: 9 mg/dL (ref 6–20)
CO2: 22 mmol/L (ref 22–32)
Calcium: 8.9 mg/dL (ref 8.9–10.3)
Chloride: 106 mmol/L (ref 98–111)
Creatinine, Ser: 0.66 mg/dL (ref 0.44–1.00)
GFR, Estimated: >60 mL/min (ref >60)
Glucose, Bld: 95 mg/dL (ref 70–99)
Potassium: 4.1 mmol/L (ref 3.5–5.1)
Sodium: 138 mmol/L (ref 135–145)
Total Bilirubin: 0.6 mg/dL (ref 0.0–1.2)
Total Protein: 7.7 g/dL (ref 6.5–8.1)

## 2024-06-14 LAB — URINALYSIS, ROUTINE W REFLEX MICROSCOPIC
Bilirubin Urine: NEGATIVE
Glucose, UA: NEGATIVE mg/dL
Ketones, ur: NEGATIVE mg/dL
Nitrite: NEGATIVE
Protein, ur: 100 mg/dL — AB
RBC / HPF: >50 RBC/hpf (ref 0–5)
Specific Gravity, Urine: 1.023 (ref 1.005–1.030)
pH: 5 (ref 5.0–8.0)

## 2024-06-14 LAB — LIPASE, BLOOD: Lipase: 33 U/L (ref 11–51)

## 2024-06-14 LAB — HCG, SERUM, QUALITATIVE: Preg, Serum: NEGATIVE

## 2024-06-14 MED ORDER — FENTANYL CITRATE PF 50 MCG/ML IJ SOSY
50.0000 ug | PREFILLED_SYRINGE | INTRAMUSCULAR | Status: AC
  Administered 2024-06-14: 50 ug via INTRAVENOUS
  Filled 2024-06-14: qty 1

## 2024-06-14 MED ORDER — ONDANSETRON HCL 4 MG/2ML IJ SOLN
4.0000 mg | Freq: Once | INTRAMUSCULAR | Status: AC
  Administered 2024-06-14: 4 mg via INTRAVENOUS
  Filled 2024-06-14: qty 2

## 2024-06-14 MED ORDER — FENTANYL CITRATE PF 50 MCG/ML IJ SOSY: 50.0000 ug | PREFILLED_SYRINGE | INTRAMUSCULAR | Status: DC | PRN

## 2024-06-14 MED ORDER — TAMSULOSIN HCL 0.4 MG PO CAPS: 0.4000 mg | ORAL_CAPSULE | Freq: Every day | ORAL | 0 refills | Status: DC

## 2024-06-14 MED ORDER — OXYCODONE-ACETAMINOPHEN 5-325 MG PO TABS: 1.0000 | ORAL_TABLET | Freq: Four times a day (QID) | ORAL | 0 refills | Status: DC | PRN

## 2024-06-14 MED ORDER — LACTATED RINGERS IV BOLUS
1000.0000 mL | Freq: Once | INTRAVENOUS | Status: AC
  Administered 2024-06-14: 1000 mL via INTRAVENOUS

## 2024-06-14 MED ORDER — OXYCODONE HCL 5 MG PO TABS: 5.0000 mg | ORAL_TABLET | Freq: Four times a day (QID) | ORAL | 0 refills | Status: DC | PRN

## 2024-06-14 MED ORDER — ONDANSETRON 4 MG PO TBDP: 4.0000 mg | ORAL_TABLET | Freq: Three times a day (TID) | ORAL | 0 refills | Status: DC | PRN

## 2024-06-14 MED ORDER — OXYCODONE-ACETAMINOPHEN 5-325 MG PO TABS
1.0000 | ORAL_TABLET | Freq: Once | ORAL | Status: AC
  Administered 2024-06-14: 1 via ORAL
  Filled 2024-06-14: qty 1

## 2024-06-14 NOTE — ED Notes (Signed)
 Patient Alert and oriented to baseline. Stable and ambulatory to baseline. Patient verbalized understanding of the discharge instructions.  Patient belongings were taken by the patient.

## 2024-06-14 NOTE — ED Provider Notes (Signed)
 [place] EMERGENCY DEPARTMENT AT [facility] Provider Note   CSN: [id number] Arrival date & time: 06/14/24  1052     Patient presents with: Abdominal Pain   Jill Riley is a 38 y.o. female.    Abdominal Pain  Patient is a 37 year old Spanish-speaking female presented emergency room today with complaints of left flank pain nausea vomiting and blood in her urine.   She has a history of 1 kidney stone several years ago.  Has no abdominal surgical history.  Denies any blood in stool.  No chest pain or difficulty breathing.  No fevers at home.  She denies any urinary urgency dysuria or frequency.   She denies any vaginal discharge or pelvic pain.  The pain is sharp severe and somewhat constant does not seem to be improved or worsens any particular position.    Prior to Admission medications   Medication Sig Start Date End Date Taking? Authorizing Provider  ondansetron  (ZOFRAN -ODT) 4 MG disintegrating tablet Take 1 tablet (4 mg total) by mouth every 8 (eight) hours as needed for nausea or vomiting. 06/14/24  Yes Calista Crain S, PA  oxyCODONE  (ROXICODONE ) 5 MG immediate release tablet Take 1 tablet (5 mg total) by mouth every 6 (six) hours as needed for severe pain (pain score 7-10). 06/14/24  Yes Joanna Hall S, PA  tamsulosin (FLOMAX) 0.4 MG CAPS capsule Take 1 capsule (0.4 mg total) by mouth daily. 06/14/24  Yes Onofre Gains, Hamp S, PA  clotrimazole -betamethasone  (LOTRISONE ) cream Apply 1 Application topically 2 (two) times daily. 03/28/24   Chrzanowski, Jami B, NP  ibuprofen  (ADVIL ) 800 MG tablet Take 1 tablet (800 mg total) by mouth every 8 (eight) hours as needed. 01/07/21   Corlis Burnard DEL, NP  norethindrone  (ORTHO MICRONOR ) 0.35 MG tablet Take 1 tablet (0.35 mg total) by mouth daily. 03/28/24   Chrzanowski, Jami B, NP  famotidine  (PEPCID ) 20 MG tablet Take 1 tablet (20 mg total) by mouth 2 (two) times daily. Patient not taking: No sig reported 08/30/19 01/07/21   Hedges, Reyes, PA-C  omeprazole  (PRILOSEC) 20 MG capsule Take 1 capsule (20 mg total) by mouth daily. Patient not taking: No sig reported 08/30/19 01/07/21  Palmer Reyes, PA-C    Allergies: Patient has no known allergies.    Review of Systems  Gastrointestinal:  Positive for abdominal pain.    Updated Vital Signs BP 134/79   Pulse 80   Temp 98 F (36.7 C)   Resp 16   Wt 98 kg   LMP 06/14/2024   SpO2 99%   BMI 35.95 kg/m   Physical Exam Vitals and nursing note reviewed.  Constitutional:      General: She is in acute distress.  HENT:     Head: Normocephalic and atraumatic.     Nose: Nose normal.   Eyes:     General: No scleral icterus.   Cardiovascular:     Rate and Rhythm: Normal rate and regular rhythm.     Pulses: Normal pulses.     Heart sounds: Normal heart sounds.  Pulmonary:     Effort: Pulmonary effort is normal. No respiratory distress.     Breath sounds: No wheezing.  Abdominal:     Palpations: Abdomen is soft.     Tenderness: There is no abdominal tenderness. There is left CVA tenderness.     Comments: No focal abdominal tenderness.  No guarding or rebound.  There is left CVA tenderness   Musculoskeletal:     Cervical  back: Normal range of motion.     Right lower leg: No edema.     Left lower leg: No edema.   Skin:    General: Skin is warm and dry.     Capillary Refill: Capillary refill takes less than 2 seconds.   Neurological:     Mental Status: She is alert. Mental status is at baseline.   Psychiatric:        Mood and Affect: Mood normal.        Behavior: Behavior normal.     (all labs ordered are listed, but only abnormal results are displayed) Labs Reviewed  URINALYSIS, ROUTINE W REFLEX MICROSCOPIC - Abnormal; Notable for the following components:      Result Value   Color, Urine AMBER (*)    APPearance CLOUDY (*)    Hgb urine dipstick LARGE (*)    Protein, ur 100 (*)    Leukocytes,Ua MODERATE (*)    Bacteria, UA FEW (*)     All other components within normal limits  LIPASE, BLOOD  COMPREHENSIVE METABOLIC PANEL WITH GFR  CBC  HCG, SERUM, QUALITATIVE    EKG: None  Radiology: CT Renal Stone Study Result Date: 06/14/2024 CLINICAL DATA:  Abdominal/flank pain, stone suspected L flank pain Patient also complains of abdominal pain, nausea, vomiting, diarrhea and hematuria. EXAM: CT ABDOMEN AND PELVIS WITHOUT CONTRAST TECHNIQUE: Multidetector CT imaging of the abdomen and pelvis was performed following the standard protocol without IV contrast. RADIATION DOSE REDUCTION: This exam was performed according to the departmental dose-optimization program which includes automated exposure control, adjustment of the mA and/or kV according to patient size and/or use of iterative reconstruction technique. COMPARISON:  Abdominopelvic CT 06/27/2017. FINDINGS: Lower chest: Clear lung bases. No significant pleural or pericardial effusion. Hepatobiliary: The liver demonstrates mildly decreased density, consistent with steatosis. 6 mm low-density lesion inferiorly in the right lobe on image 32/3 was probably present on the previous study and may reflect a small cyst or hemangioma. No suspicious liver lesions on noncontrast imaging. No evidence of gallstones, gallbladder wall thickening or biliary dilatation. Pancreas: Unremarkable. No pancreatic ductal dilatation or surrounding inflammatory changes. Spleen: Normal in size without focal abnormality. Adrenals/Urinary Tract: Both adrenal glands appear normal. Small nonobstructing calculi in the lower poles of both kidneys. In addition, there is a partially obstructing calculus at the left ureteropelvic junction, measuring 8 mm on coronal image 55/6. This appears visible on the scout image, overlapping the left L3 transverse process. Mild associated left hydronephrosis and proximal hydroureter with mild periureteral soft tissue stranding. No right-sided hydronephrosis or right ureteral calculus. The  bladder appears unremarkable for its degree of distention. Stomach/Bowel: No enteric contrast administered. The stomach appears unremarkable for its degree of distension. No evidence of bowel wall thickening, distention or surrounding inflammatory change. The appendix appears normal. Mild distal colonic diverticulosis without evidence of acute inflammation. Vascular/Lymphatic: There are no enlarged abdominal or pelvic lymph nodes. No significant vascular findings on noncontrast imaging. Reproductive: The uterus and ovaries appear normal. No evidence of adnexal mass. Other: No evidence of abdominal wall mass or hernia. No ascites or pneumoperitoneum. Musculoskeletal: No acute or significant osseous findings. Degenerative disc disease at L5-S1. IMPRESSION: 1. Partially obstructing 8 mm calculus at the left ureteropelvic junction with mild associated hydronephrosis and proximal hydroureter. 2. Additional small nonobstructing calculi in the lower poles of both kidneys. 3. Mild hepatic steatosis. 4. Mild distal colonic diverticulosis without evidence of acute inflammation. Electronically Signed   By: Elsie Gertrude HERO.D.  On: 06/14/2024 15:28     Procedures   Medications Ordered in the ED  ondansetron  (ZOFRAN ) injection 4 mg (4 mg Intravenous Given 06/14/24 1420)  lactated ringers  bolus 1,000 mL (0 mLs Intravenous Stopped 06/14/24 1649)  fentaNYL  (SUBLIMAZE ) injection 50 mcg (50 mcg Intravenous Given 06/14/24 1422)  oxyCODONE -acetaminophen  (PERCOCET/ROXICET) 5-325 MG per tablet 1 tablet (1 tablet Oral Given 06/14/24 1421)                                    Medical Decision Making Amount and/or Complexity of Data Reviewed Labs: ordered. Radiology: ordered.  Risk Prescription drug management.   Patient is a 38 year old Spanish-speaking female presented emergency room today with complaints of left flank pain nausea vomiting and blood in her urine.  She has a history of 1 kidney stone several years ago.   Has no abdominal surgical history.  Denies any blood in stool.  No chest pain or difficulty breathing.  No fevers at home.  She denies any urinary urgency dysuria or frequency.  She denies any vaginal discharge or pelvic pain.  The pain is sharp severe and somewhat constant does not seem to be improved or worsens any particular position.  The differential diagnosis of emergent flank pain includes, but is not limited to :Abdominal aortic aneurysm,, Renal artery embolism,Renal vein thrombosis, Aortic dissection, Mesenteric ischemia, Pyelonephritis, Renal infarction, Renal hemorrhage, Nephrolithiasis/ Renal Colic, Bladder tumor,Cystitis, Biliary colic, Pancreatitis Perforated peptic ulcer Appendicitis ,Inguinal Hernia, Diverticulitis, Bowel obstruction Ectopic Pregnancy,PID/TOA,Ovarian cyst, Ovarian torsion)  Shingles Lower lobe pneumonia, Retroperitoneal hematoma/abscess/tumor, Epidural abscess, Epidural hematoma   Patient has left CVA tenderness, no focal abdominal tenderness no guarding or rebound on exam.  Vital signs normal  CMP CBC and lipase normal hCG negative for pregnancy.  Urinalysis positive for large hemoglobin, greater than 50 RBCs not particularly concerning for urinary tract infection.  CT renal stone study ordered and pending.  CT renal stone shows 8mm stone   Patient sent home with analgesics in the form of oxycodone , tamsulosin, Zofran  for any nausea.  Ibuprofen  and Tylenol  recommendations made.  She will follow-up with urology.  Patient agreeable to plan husband at bedside also agreeable to plan.  Return precautions discussed  Final diagnoses:  Kidney stone    ED Discharge Orders          Ordered    ondansetron  (ZOFRAN -ODT) 4 MG disintegrating tablet  Every 8 hours PRN        06/14/24 1619    oxyCODONE -acetaminophen  (PERCOCET/ROXICET) 5-325 MG tablet  Every 6 hours PRN,   Status:  Discontinued        06/14/24 1619    tamsulosin (FLOMAX) 0.4 MG CAPS capsule  Daily         06/14/24 1619    oxyCODONE  (ROXICODONE ) 5 MG immediate release tablet  Every 6 hours PRN        06/14/24 1622               Neldon Hamp RAMAN, [place] 06/14/24 1726    Freddi Hamilton, MD 06/15/24 1024

## 2024-06-14 NOTE — ED Triage Notes (Signed)
 Reports abd pain, nausea, vomiting, and hematuria since this morning.  Denies diarrhea.

## 2024-06-14 NOTE — Discharge Instructions (Addendum)
 You were found to have a kidney stone.  I have given you the information for a urologist to follow-up with.  Take Zofran  as prescribed as needed for nausea, Tylenol  1000 mg every 6 hours, ibuprofen  600 mg every 6 hours.  Take the tamsulosin I have prescribed you once daily.  You may always return to the emergency room for reevaluation or if your pain becomes out of control however I have also written a prescription for Percocet for breakthrough pain.  Use this only if needed as it can cause some nausea and constipation.    Se le detect un clculo renal. Le he dado la informacin para que un Data processing manager. Tome Zofran  segn lo recetado para las nuseas, Tylenol  1000 mg cada 6 horas e ibuprofeno 600 mg cada 6 horas.  Tome la tamsulosina que le recet una vez al da.  Siempre puede volver a urgencias para una reevaluacin o si el dolor se vuelve incontrolable. Sin embargo, tambin le he recetado roxicodone /oxycodone  para Chief Technology Officer irruptivo. selo solo si es necesario, ya que puede causar nuseas y Retail buyer.

## 2024-06-14 NOTE — ED Provider Triage Note (Signed)
 Emergency Medicine Provider Triage Evaluation Note  Jill Riley , a 38 y.o. female  was evaluated in triage.  Pt complains of abdominal pain, nausea, vomiting diarrhea.  Also endorses hematuria..  Review of Systems  Positive:  Negative:   Physical Exam  BP (!) 147/93   Pulse 79   Temp 98 F (36.7 C)   Resp 18   Wt 98 kg   LMP 06/14/2024   SpO2 98%   BMI 35.95 kg/m  Gen:   Awake, no distress   Resp:  Normal effort  MSK:   Moves extremities without difficulty  Other:    Medical Decision Making  Medically screening exam initiated at 11:45 AM.  Appropriate orders placed.  Jill Riley was informed that the remainder of the evaluation will be completed by another provider, this initial triage assessment does not replace that evaluation, and the importance of remaining in the ED until their evaluation is complete.  Patient with mild abdominal tenderness.  Blood work, urinalysis ordered.  Will defer imaging decision to primary physician.   Jill Pac T, DO 06/14/24 1146

## 2024-06-20 ENCOUNTER — Other Ambulatory Visit: Payer: Self-pay | Admitting: Urology

## 2024-06-20 NOTE — Progress Notes (Signed)
 COVID Vaccine Completed:  Date of COVID positive in last 90 days:  PCP - No Cardiologist - No  Chest x-ray - 12-11-23 Epic EKG - 12-14-23 Epic Stress Test - N/A ECHO - N/A Cardiac Cath - N/A Pacemaker/ICD device last checked:N/A Spinal Cord Stimulator:N/A  Bowel Prep - N/A  Sleep Study - N/A CPAP -   Fasting Blood Sugar - N/A Checks Blood Sugar _____ times a day  Last dose of GLP1 agonist-  N/A GLP1 instructions:  Do not take after     Last dose of SGLT-2 inhibitors-  N/A SGLT-2 instructions:  Do not take after    Blood Thinner Instructions:  Last dose:   Time: Aspirin Instructions: Last Dose:  Activity level:  Can go up a flight of stairs and perform activities of daily living without stopping and without symptoms of chest pain or shortness of breath.  Anesthesia review: N/A  Patient denies shortness of breath, fever, cough and chest pain at PAT appointment  Patient verbalized understanding of instructions that were given to them at the PAT appointment. Patient was also instructed that they will need to review over the PAT instructions again at home before surgery.

## 2024-06-20 NOTE — Patient Instructions (Signed)
 DEBIDO AL COVID-19 SLO SE PERMITEN DOS VISITANTES (de 16 aos en adelante)  PARA QUE VENGAN CON USTED Y SE QUEDEN EN LA SALA DE ESPERA SOLAMENTE DURANTE EL PRE OP Y EL PROCEDIMIENTO.   **NO SE PERMITEN VISITAS EN EL REA DE CORTA ESTADA NI EN LA SALA DE RECUPERACIN!!**  SI VA A SER INGRESADO(A) AL HOSPITAL SLO SE LE PERMITEN CUATRO PERSONAS DE APOYO DURANTE LAS HORAS DE VISITA (7 AM -8PM)   La(s) persona(s) de apoyo debe(n) pasar nuestra evaluacin, entrar y salir con gel y usar la mscara en todo momento, incluso en la habitacin del paciente. Los pacientes tambin deben usar una mscara cuando el personal o su persona de apoyo estn en la habitacin. Los visitantes DEBEN LLEVAR ETIQUETA DE VISITANTE DE UNA MANERA VISIBLE. Un visitante adulto puede permanecer con usted durante la noche y DEBE estar en la habitacin a las 8 P.M.     Su procedimiento est programado en: 06-22-24   Presntese a la entrada principal del [facility]     Presntese a admisiones por la maana 9:00 AM   Llame a este nmero si tiene problemas la maana de la ciruga al [phone]   No consuma alimentos o lquidos: Despus de la medianoche           Si tiene preguntas, por favor. Pngase en contacto con la oficina de su cirujano.   SIGA LA PREPARACIN INTESTINAL Y CUALQUIER INSTRUCCIN ADICIONAL PREOPERATORIA QUE HAYA RECIBIDO DEL OFICINA DE DU CIRUJANO!!!     La higiene bucal tambin es importante para reducir el riesgo de infeccin.                                   Recuerde - LVESE LOS DIENTES EN LA MAANA DE LA CIRUGA CON SU PASTA DENTAL HABITUAL   NO fume despus de la medianoche   [employer] medicamentos en la maana de la ciruga con UN SORBO DE AGUA:    Norethindrone    Tamsulosin    If needed Oxycodone , Ondansetron                               No debe trae ningn metal en el cuerpo, incluyendo pinzas para el cabello, joyas, ni aretes/pendientes             No use maquillaje,  lociones/cremas, polvos, perfumes o desodorante  No use esmalte de uas, incluyendo los de gel ni S&S, uas artificiales/acrlicas o cualquier otro tipo de cobertura en las uas naturales, incluyendo las uas de las manos y [employer]. Si tiene uas artificiales, con capas de gel, etc. que necesite que le quiten en un saln de uas, por favor, pida que se lo quiten antes de la ciruga o la ciruga podra ser cancelada/retrasada si el cirujano o el anestesilogo consideran que no puede ser monitoreado(a) de una forma segura.   No se rasure en las 48 horas antes de la operacin.          No traiga objetos de valor al hospital. [name] NO SE HACE RESPONSABLE DE LOS OBJETOS DE VALOR.   Los contactos, las dentaduras o los puentes no se pueden usar durante la [place].   [name] una bolsa pequea para la noche el da de la [place].   NO TRAIGA AL HOSPITAL LOS MEDICAMENTOS QUE TOMA EN CASA . LA FARMACIA LE SUMINISTRAR  LOS MEDICAMENTOS QUE TENGA EN SU LISTA DE MEDICAMENTOS DURANTE SU ESTADA EN EL HOSPITAL!    Los pacientes dados de alta el mismo da de la ciruga no podrn Company secretary a casa.  Es NECESARIO que alguien se quede con usted durante las primeras 24 horas despus de la anestesia.   Instrucciones especiales: [name] query copia de sus documentos de poder notarial y testamento vital el da de su ciruga si no los ha escaneado antes.              Por favor, lea las siguientes hojas informativas que le dieron: SI TIENE PREGUNTAS SOBRE SUS INSTRUCCIONES PREOPERATORIAS POR FAVOR LLAME AL [phone] [name]                       PREPARACIN PARA LA CIRUGA                                            Preparing for Surgery  Debido a que la piel no est esterilizada, sta necesita estar lo ms libre de grmenes como sea posible.  Usted puede reducir el nmero de grmenes en la piel lavndose con el jabn de CHG (Chlorahexidine gluconate) antes de la ciruga.  El CHG es un jabn antisptico el cual mata  los grmenes y se une a la piel para continuar matando los grmenes incluso hasta despus de lavarse. POR FAVOR NO LO USE SI USTED TIENE ALERGIAS AL CHG.  SI LA PIEL SE IRRITA, DEJE DE USAR EL CHG.  NO SE RASURE DURANTE AL MENOS 12 HORAS ANTES DE LA PRIMERA DUCHA CON EL CHG. Siga estas instrucciones cuidadosamente:  Dchese la noche anterior a la [place] y de nuevo en la maana de la [place]. Si decide lavarse el cabello, lvelo con su champ normal primero. Enjuague el cabello y el cuerpo para quitarse el [place]. Use el CHG como lo hara con cualquier otro jabn lquido, usando una toallita o esponja vegetal o exfoliante. Aplique el CHG al cuerpo solamente DEL CUELLO PARA ABAJO.  No lo use cerca de los ojos o los genitales. No se lave con su jabn normal despus de usar el CHG. Squese con una toalla limpia. Espere hasta la maana siguiente para aplicarse desodorantes, lociones, excepto en el da de la [place], NO SE APLIQUE LOCIONES. Use pijamas limpias o una bata. Coloque sbanas limpias en su cama la noche de su primera ducha - no duerma con mascotas. 10.  Use ropa limpia al venir al hospital.         SURGICAL WAITING ROOM VISITATION Patients having surgery or a procedure may have no more than 2 support people in the waiting area - these visitors may rotate.    Children under the age of 27 must have an adult with them who is not the patient.  If the patient needs to stay at the hospital during part of their recovery, the visitor guidelines for inpatient rooms apply. Pre-op nurse will coordinate an appropriate time for 1 support person to accompany patient in pre-op.  This support person may not rotate.    Please refer to the [facility] website for the visitor guidelines for Inpatients (after your surgery is over and you are in a regular room).       Your procedure is scheduled on: 06-22-24   Report to [facility] Main Entrance  Report to admitting at 9:00 AM   Call this  number if you have problems the morning of surgery [phone]   Do not eat food or drink liquids:After Midnight.          If you have questions, please contact your surgeon's office.   FOLLOW  ANY ADDITIONAL PRE OP INSTRUCTIONS YOU RECEIVED FROM YOUR SURGEON'S OFFICE!!!     Oral Hygiene is also important to reduce your risk of infection.                                    Remember - BRUSH YOUR TEETH THE MORNING OF SURGERY WITH YOUR REGULAR TOOTHPASTE   Do NOT smoke after Midnight   Take these medicines the morning of surgery with A SIP OF WATER :    Norethindrone    Tamsulosin    If needed Oxycodone , Ondansetron   Stop all vitamins and herbal supplements 7 days before surgery                              You may not have any metal on your body including hair pins, jewelry, and body piercing             Do not wear make-up, lotions, powders, perfumes or deodorant  Do not wear nail polish including gel and S&S, artificial/acrylic nails, or any other type of covering on natural nails including finger and toenails. If you have artificial nails, gel coating, etc. that needs to be removed by a nail salon please have this removed prior to surgery or surgery may need to be canceled/ delayed if the surgeon/ anesthesia feels like they are unable to be safely monitored.   Do not shave  48 hours prior to surgery.    Do not bring valuables to the hospital. [place] IS NOT RESPONSIBLE   FOR VALUABLES.   Contacts, dentures or bridgework may not be worn into surgery.   Bring small overnight bag day of surgery.   DO NOT BRING YOUR HOME MEDICATIONS TO THE HOSPITAL. PHARMACY WILL DISPENSE MEDICATIONS LISTED ON YOUR MEDICATION LIST TO YOU DURING YOUR ADMISSION IN THE HOSPITAL!    Patients discharged on the day of surgery will not be allowed to drive home.  Someone NEEDS to stay with you for the first 24 hours after anesthesia.   Special Instructions: Bring a copy of your healthcare power of  attorney and living will documents the day of surgery if you haven't scanned them before.              Please read over the following fact sheets you were given: IF YOU HAVE QUESTIONS ABOUT YOUR PRE-OP INSTRUCTIONS PLEASE CALL [phone] [name]  If you received a COVID test during your pre-op visit  it is requested that you wear a mask when out in public, stay away from anyone that may not be feeling well and notify your surgeon if you develop symptoms. If you test positive for Covid or have been in contact with anyone that has tested positive in the last 10 days please notify you surgeon.  [place] - Preparing for Surgery Before surgery, you can play an important role.  Because skin is not sterile, your skin needs to be as free of germs as possible.  You can reduce the number of germs on your skin by washing with CHG (chlorahexidine gluconate) soap before  surgery.  CHG is an antiseptic cleaner which kills germs and bonds with the skin to continue killing germs even after washing. Please DO NOT use if you have an allergy to CHG or antibacterial soaps.  If your skin becomes reddened/irritated stop using the CHG and inform your nurse when you arrive at Short Stay. Do not shave (including legs and underarms) for at least 48 hours prior to the first CHG shower.  You may shave your face/neck.  Please follow these instructions carefully:  1.  Shower with CHG Soap the night before surgery and the  morning of surgery.  2.  If you choose to wash your hair, wash your hair first as usual with your normal  shampoo.  3.  After you shampoo, rinse your hair and body thoroughly to remove the shampoo.                             4.  Use CHG as you would any other liquid soap.  You can apply chg directly to the skin and wash.  Gently with a scrungie or clean washcloth.  5.  Apply the CHG Soap to your body ONLY FROM THE NECK DOWN.   Do   not use on face/ open                           Wound or open sores. Avoid  contact with eyes, ears mouth and   genitals (private parts).                       Wash face,  Genitals (private parts) with your normal soap.             6.  Wash thoroughly, paying special attention to the area where your    surgery  will be performed.  7.  Thoroughly rinse your body with warm water  from the neck down.  8.  DO NOT shower/wash with your normal soap after using and rinsing off the CHG Soap.                9.  Pat yourself dry with a clean towel.            10.  Wear clean pajamas.            11.  Place clean sheets on your bed the night of your first shower and do not  sleep with pets. Day of Surgery : Do not apply any lotions/deodorants the morning of surgery.  Please wear clean clothes to the hospital/surgery center.  FAILURE TO FOLLOW THESE INSTRUCTIONS MAY RESULT IN THE CANCELLATION OF YOUR SURGERY  PATIENT SIGNATURE_________________________________  NURSE SIGNATURE__________________________________  ________________________________________________________________________

## 2024-06-21 ENCOUNTER — Other Ambulatory Visit: Payer: Self-pay

## 2024-06-21 ENCOUNTER — Encounter (HOSPITAL_COMMUNITY)
Admission: RE | Admit: 2024-06-21 | Discharge: 2024-06-21 | Disposition: A | Discharge status: Home / Self Care | Source: Ambulatory Visit | Attending: Urology | Admitting: Urology

## 2024-06-21 ENCOUNTER — Encounter (HOSPITAL_COMMUNITY): Payer: Self-pay

## 2024-06-21 VITALS — BP 137/95 | HR 86 | Temp 97.7°F | Resp 16 | Ht 63.0 in | Wt 216.0 lb

## 2024-06-21 DIAGNOSIS — Z01818 Encounter for other preprocedural examination: Secondary | ICD-10-CM | POA: Insufficient documentation

## 2024-06-21 HISTORY — DX: Depression, unspecified: F32.A

## 2024-06-21 NOTE — H&P (Signed)
 Office Visit Report     06/16/2024   --------------------------------------------------------------------------------   Jill Riley  MRN: [id number]  DOB: Jan 24, 1986, 38 year old Female  SSN: **[id number]   PRIMARY CARE:     REFERRING:    PROVIDER:  Ricardo Likens, M.D.  TREATING:  Lonni Han, M.D.  LOCATION:  [facility] [phone]     --------------------------------------------------------------------------------   CC: I have pain in the flank.  HPI: Jill Riley is a 38 year-old female patient who is here for flank pain.  The problem is on the left side. Her pain started about approximately 06/02/2024. The pain is sharp. The pain is intermittent. The pain does radiate.   Pain medications< makes the pain better. Nothing causes the pain to become worse. She was treated with the following pain medication(s): Oxycodone .   She has had this same pain previously. She has had kidney stones.   -8 mm left UPJ stone with mild hydro on CTSS from 06/14/24--(visible on scout- [phone] HU)  -Normal renal function and WBC  -UA positive for moderate bacteria, RBCs and WBCs--nitrite negative  -Today, she reports intermittent episodes of sharp left sided flank pain associated with nausea and hematuria (currently on her menstrual cycle). She denies fever/chills or dysuria. AFVSS today.     ALLERGIES: None   MEDICATIONS: Tamsulosin  HCl 0.4 MG Capsule  Ondansetron  4 MG Tablet Disintegrating  oxyCODONE  HCl 5 MG Tablet     GU PSH: Cysto Remove Stent FB Sim - 2018 Cystoscopy Insert Stent, Left - 2018 Cystoscopy Ureteroscopy, Left - 2018 Ureteroscopic laser litho, Left - 2018, Right - 2018       PSH Notes: Hand   NON-GU PSH: Bilateral Tubal Ligation     GU PMH: Renal calculus (Worsening, Chronic), Bilateral - 2018    NON-GU PMH: Anxiety Arrhythmia Depression GERD    FAMILY HISTORY: 1 son - Son 3 daughters - Daughter Heart Disease -  Mother Hypertension - Mother kidney stone - Runs in Family   SOCIAL HISTORY: Marital Status: Married Ethnicity: ; Race: Other Race Current Smoking Status: Patient has never smoked.   Tobacco Use Assessment Completed: Used Tobacco in last 30 days? Has never drank.  Drinks 1 caffeinated drink per day. 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    REVIEW OF SYSTEMS:    GU Review Female:   Patient denies frequent urination, hard to postpone urination, burning /pain with urination, get up at night to urinate, leakage of urine, stream starts and stops, trouble starting your stream, have to strain to urinate, and being pregnant.  Gastrointestinal (Upper):   Patient denies nausea, vomiting, and indigestion/ heartburn.  Gastrointestinal (Lower):   Patient denies diarrhea and constipation.  Constitutional:   Patient denies fatigue, night sweats, weight loss, and fever.  Skin:   Patient denies skin rash/ lesion and itching.  Eyes:   Patient denies blurred vision and double vision.  Ears/ Nose/ Throat:   Patient denies sore throat and sinus problems.  Hematologic/Lymphatic:   Patient denies swollen glands and easy bruising.  Cardiovascular:   Patient denies leg swelling and chest pains.  Respiratory:   Patient denies cough and shortness of breath.  Endocrine:   Patient denies excessive thirst.  Musculoskeletal:   Patient denies back pain and joint pain.  Neurological:   Patient denies headaches and dizziness.  Psychologic:   Patient denies depression and anxiety.   VITAL SIGNS:      06/16/2024 09:14 AM  Weight 214 lb / 97.07 kg  Height 63 in / 160.02 cm  BP 126/86 mmHg  Pulse 75 /min  Temperature 97.8 F / 36.5 C  BMI 37.9 kg/m   MULTI-SYSTEM PHYSICAL EXAMINATION:    Constitutional: Well-nourished. No physical deformities. Normally developed. Good grooming. No acute distress  Neurologic / Psychiatric: Oriented to time, oriented to place, oriented to person. No depression, no anxiety,  no agitation.     Complexity of Data:  Lab Test Review:   BMP, CBC with Diff  Urine Test Review:   Urinalysis  X-Ray Review: C.T. Abdomen/Pelvis: Reviewed Films. Reviewed Report. Discussed With Patient. CLINICAL DATA: Abdominal/flank pain, stone suspected L flank pain   Patient also complains of abdominal pain, nausea, vomiting, diarrhea  and hematuria.   EXAM:  CT ABDOMEN AND PELVIS WITHOUT CONTRAST   TECHNIQUE:  Multidetector CT imaging of the abdomen and pelvis was performed  following the standard protocol without IV contrast.   RADIATION DOSE REDUCTION: This exam was performed according to the  departmental dose-optimization program which includes automated  exposure control, adjustment of the mA and/or kV according to  patient size and/or use of iterative reconstruction technique.   COMPARISON: Abdominopelvic CT 06/27/2017.   FINDINGS:  Lower chest: Clear lung bases. No significant pleural or pericardial  effusion.   Hepatobiliary: The liver demonstrates mildly decreased density,  consistent with steatosis. 6 mm low-density lesion inferiorly in the  right lobe on image 32/3 was probably present on the previous study  and may reflect a small cyst or hemangioma. No suspicious liver  lesions on noncontrast imaging. No evidence of gallstones,  gallbladder wall thickening or biliary dilatation.   Pancreas: Unremarkable. No pancreatic ductal dilatation or  surrounding inflammatory changes.   Spleen: Normal in size without focal abnormality.   Adrenals/Urinary Tract: Both adrenal glands appear normal. Small  nonobstructing calculi in the lower poles of both kidneys. In  addition, there is a partially obstructing calculus at the left  ureteropelvic junction, measuring 8 mm on coronal image 55/6. This  appears visible on the scout image, overlapping the left L3  transverse process. Mild associated left hydronephrosis and proximal  hydroureter with mild periureteral soft  tissue stranding. No  right-sided hydronephrosis or right ureteral calculus. The bladder  appears unremarkable for its degree of distention.   Stomach/Bowel: No enteric contrast administered. The stomach appears  unremarkable for its degree of distension. No evidence of bowel wall  thickening, distention or surrounding inflammatory change. The  appendix appears normal. Mild distal colonic diverticulosis without  evidence of acute inflammation.   Vascular/Lymphatic: There are no enlarged abdominal or pelvic lymph  nodes. No significant vascular findings on noncontrast imaging.   Reproductive: The uterus and ovaries appear normal. No evidence of  adnexal mass.   Other: No evidence of abdominal wall mass or hernia. No ascites or  pneumoperitoneum.   Musculoskeletal: No acute or significant osseous findings.  Degenerative disc disease at L5-S1.   IMPRESSION:  1. Partially obstructing 8 mm calculus at the left ureteropelvic  junction with mild associated hydronephrosis and proximal  hydroureter.  2. Additional small nonobstructing calculi in the lower poles of  both kidneys.  3. Mild hepatic steatosis.  4. Mild distal colonic diverticulosis without evidence of acute  inflammation.    Electronically Signed  By: Elsie Perone M.D.  On: 06/14/2024 15:28      PROCEDURES:          Urinalysis w/Scope - 81001 Dipstick Dipstick Cont'd Micro  Color: Red Bilirubin: Invalid WBC/hpf: 0 -  5/hpf  Appearance: Cloudy Ketones: Invalid RBC/hpf: >60/hpf  Specific Gravity: Invalid Blood: Invalid Bacteria: Few (10-25/hpf)  pH: Invalid Protein: Invalid Cystals: NS (Not Seen)  Glucose: Invalid Urobilinogen: Invalid Casts: NS (Not Seen)    Nitrites: Invalid Trichomonas: Not Present    Leukocyte Esterase: Invalid Mucous: Not Present      Epithelial Cells: 0 - 5/hpf      Yeast: NS (Not Seen)      Sperm: Not Present    Notes:  **Unspun micro**     ASSESSMENT:      ICD-10 Details  1 GU:    Flank Pain - R10.84 Left, Undiagnosed New Problem  2   Ureteral calculus - N20.1 Left, Undiagnosed New Problem   PLAN:            Medications New Meds: Cephalexin  500 MG Capsule 1 capsule PO BID   #14  0 Refill(s)  Pharmacy Name:  [facility] [phone]  Address:  [address]   [place], [place] [id number]  Phone:  [phone]  Fax:  [phone]            Schedule Labs: Today 06/15/2024 - Urine Culture  Return Visit/Planned Activity: Next Available Appointment - Schedule Surgery          Document Letter(s):  Created for Patient: Clinical Summary         Notes:   The risks, benefits and alternatives of cystoscopy with LEFT ureteroscopy, laser lithotripsy and ureteral stent placement was discussed the patient. Risks included, but are not limited to: bleeding, urinary tract infection, ureteral injury/avulsion, ureteral stricture formation, retained stone fragments, the possibility that multiple surgeries may be required to treat the stone(s), MI, stroke, PE and the inherent risks of general anesthesia. The patient voices understanding and wishes to proceed.  -Continue percocet, zofran  and tamsulosin   - We discussed criteria to return to clinic or proceed to the ER which include: Fever/chills, worsening pain, nausea/vomiting and/or persistent gross hematuria.

## 2024-06-22 ENCOUNTER — Ambulatory Visit (HOSPITAL_COMMUNITY): Admitting: Anesthesiology

## 2024-06-22 ENCOUNTER — Encounter (HOSPITAL_COMMUNITY)
Admission: RE | Disposition: A | Discharge status: Home / Self Care | Payer: Self-pay | Source: Ambulatory Visit | Attending: Urology

## 2024-06-22 ENCOUNTER — Ambulatory Visit (HOSPITAL_COMMUNITY)

## 2024-06-22 ENCOUNTER — Encounter (HOSPITAL_COMMUNITY): Payer: Self-pay | Admitting: Urology

## 2024-06-22 ENCOUNTER — Ambulatory Visit (HOSPITAL_COMMUNITY): Payer: Self-pay | Admitting: Medical

## 2024-06-22 ENCOUNTER — Ambulatory Visit (HOSPITAL_COMMUNITY)
Admission: RE | Admit: 2024-06-22 | Discharge: 2024-06-22 | Disposition: A | Discharge status: Home / Self Care | Source: Ambulatory Visit | Attending: Urology | Admitting: Urology

## 2024-06-22 DIAGNOSIS — N201 Calculus of ureter: Secondary | ICD-10-CM | POA: Insufficient documentation

## 2024-06-22 DIAGNOSIS — J45909 Unspecified asthma, uncomplicated: Secondary | ICD-10-CM | POA: Insufficient documentation

## 2024-06-22 DIAGNOSIS — Z01818 Encounter for other preprocedural examination: Secondary | ICD-10-CM

## 2024-06-22 HISTORY — PX: CYSTOSCOPY/URETEROSCOPY/HOLMIUM LASER/STENT PLACEMENT: SHX6546

## 2024-06-22 LAB — POCT PREGNANCY, URINE: Preg Test, Ur: NEGATIVE

## 2024-06-22 SURGERY — CYSTOSCOPY/URETEROSCOPY/HOLMIUM LASER/STENT PLACEMENT
Anesthesia: General | Laterality: Left

## 2024-06-22 MED ORDER — DROPERIDOL 2.5 MG/ML IJ SOLN: 0.6250 mg | Freq: Once | INTRAMUSCULAR | Status: DC | PRN

## 2024-06-22 MED ORDER — SCOPOLAMINE 1 MG/3DAYS TD PT72: 1.0000 | MEDICATED_PATCH | Freq: Once | TRANSDERMAL | Status: DC

## 2024-06-22 MED ORDER — HYDROMORPHONE HCL 1 MG/ML IJ SOLN
0.2500 mg | INTRAMUSCULAR | Status: DC | PRN
  Administered 2024-06-22 (×3): 0.5 mg via INTRAVENOUS

## 2024-06-22 MED ORDER — PROPOFOL 10 MG/ML IV BOLUS
INTRAVENOUS | Status: DC | PRN
  Administered 2024-06-22: 200 mg via INTRAVENOUS

## 2024-06-22 MED ORDER — HYDROMORPHONE HCL 1 MG/ML IJ SOLN
INTRAMUSCULAR | Status: AC
  Filled 2024-06-22: qty 1

## 2024-06-22 MED ORDER — LACTATED RINGERS IV SOLN: INTRAVENOUS | Status: DC

## 2024-06-22 MED ORDER — ACETAMINOPHEN 10 MG/ML IV SOLN: 1000.0000 mg | Freq: Once | INTRAVENOUS | Status: DC | PRN

## 2024-06-22 MED ORDER — ORAL CARE MOUTH RINSE: 15.0000 mL | Freq: Once | OROMUCOSAL | Status: AC

## 2024-06-22 MED ORDER — OXYBUTYNIN CHLORIDE 5 MG PO TABS: 5.0000 mg | ORAL_TABLET | Freq: Three times a day (TID) | ORAL | 1 refills | Status: DC | PRN

## 2024-06-22 MED ORDER — ONDANSETRON HCL 4 MG/2ML IJ SOLN
INTRAMUSCULAR | Status: DC | PRN
  Administered 2024-06-22: 4 mg via INTRAVENOUS

## 2024-06-22 MED ORDER — LIDOCAINE HCL (PF) 2 % IJ SOLN
INTRAMUSCULAR | Status: DC | PRN
  Administered 2024-06-22: 60 mg via INTRADERMAL

## 2024-06-22 MED ORDER — OXYCODONE-ACETAMINOPHEN 5-325 MG PO TABS: 1.0000 | ORAL_TABLET | ORAL | 0 refills | Status: DC | PRN

## 2024-06-22 MED ORDER — CEFAZOLIN SODIUM-DEXTROSE 2-4 GM/100ML-% IV SOLN
2.0000 g | INTRAVENOUS | Status: AC
  Administered 2024-06-22: 2 g via INTRAVENOUS
  Filled 2024-06-22: qty 100

## 2024-06-22 MED ORDER — SODIUM CHLORIDE 0.9 % IR SOLN
Status: DC | PRN
  Administered 2024-06-22: 3000 mL via INTRAVESICAL

## 2024-06-22 MED ORDER — IOHEXOL 300 MG/ML  SOLN
INTRAMUSCULAR | Status: DC | PRN
  Administered 2024-06-22: 10 mL via URETHRAL

## 2024-06-22 MED ORDER — MIDAZOLAM HCL 2 MG/2ML IJ SOLN
INTRAMUSCULAR | Status: AC
  Filled 2024-06-22: qty 2

## 2024-06-22 MED ORDER — FENTANYL CITRATE PF 50 MCG/ML IJ SOSY
25.0000 ug | PREFILLED_SYRINGE | INTRAMUSCULAR | Status: DC | PRN
  Administered 2024-06-22 (×3): 50 ug via INTRAVENOUS

## 2024-06-22 MED ORDER — PROPOFOL 10 MG/ML IV BOLUS
INTRAVENOUS | Status: AC
  Filled 2024-06-22: qty 20

## 2024-06-22 MED ORDER — OXYCODONE HCL 5 MG PO TABS: 5.0000 mg | ORAL_TABLET | Freq: Once | ORAL | Status: DC | PRN

## 2024-06-22 MED ORDER — PHENAZOPYRIDINE HCL 200 MG PO TABS: 200.0000 mg | ORAL_TABLET | Freq: Three times a day (TID) | ORAL | 0 refills | Status: DC | PRN

## 2024-06-22 MED ORDER — CHLORHEXIDINE GLUCONATE 0.12 % MT SOLN
15.0000 mL | Freq: Once | OROMUCOSAL | Status: AC
  Administered 2024-06-22: 15 mL via OROMUCOSAL

## 2024-06-22 MED ORDER — MIDAZOLAM HCL 5 MG/5ML IJ SOLN
INTRAMUSCULAR | Status: DC | PRN
  Administered 2024-06-22: 2 mg via INTRAVENOUS

## 2024-06-22 MED ORDER — DEXAMETHASONE SODIUM PHOSPHATE 10 MG/ML IJ SOLN
INTRAMUSCULAR | Status: DC | PRN
  Administered 2024-06-22: 10 mg via INTRAVENOUS

## 2024-06-22 MED ORDER — FENTANYL CITRATE (PF) 100 MCG/2ML IJ SOLN
INTRAMUSCULAR | Status: AC
Start: 2024-06-22 — End: 2024-06-22
  Filled 2024-06-22: qty 2

## 2024-06-22 MED ORDER — OXYCODONE HCL 5 MG/5ML PO SOLN: 5.0000 mg | Freq: Once | ORAL | Status: DC | PRN

## 2024-06-22 MED ORDER — FENTANYL CITRATE PF 50 MCG/ML IJ SOSY
PREFILLED_SYRINGE | INTRAMUSCULAR | Status: AC
  Filled 2024-06-22: qty 1

## 2024-06-22 MED ORDER — FENTANYL CITRATE (PF) 100 MCG/2ML IJ SOLN
INTRAMUSCULAR | Status: DC | PRN
  Administered 2024-06-22: 25 ug via INTRAVENOUS
  Administered 2024-06-22: 50 ug via INTRAVENOUS
  Administered 2024-06-22: 25 ug via INTRAVENOUS

## 2024-06-22 MED ORDER — FENTANYL CITRATE PF 50 MCG/ML IJ SOSY
PREFILLED_SYRINGE | INTRAMUSCULAR | Status: AC
  Filled 2024-06-22: qty 2

## 2024-06-22 MED ORDER — ACETAMINOPHEN 500 MG PO TABS: 1000.0000 mg | ORAL_TABLET | Freq: Once | ORAL | Status: DC

## 2024-06-22 SURGICAL SUPPLY — 18 items
BAG URO CATCHER STRL LF (MISCELLANEOUS) ×1 IMPLANT
BASKET ZERO TIP NITINOL 2.4FR (BASKET) IMPLANT
CATH URETL OPEN 5X70 (CATHETERS) ×1 IMPLANT
CLOTH BEACON ORANGE TIMEOUT ST (SAFETY) ×1 IMPLANT
EXTRACTOR STONE NITINOL NGAGE (UROLOGICAL SUPPLIES) IMPLANT
FIBER LASER MOSES 200 DFL (Laser) IMPLANT
GLOVE SURG LX STRL 8.0 MICRO (GLOVE) ×1 IMPLANT
GOWN STRL SURGICAL XL XLNG (GOWN DISPOSABLE) ×1 IMPLANT
GUIDEWIRE STR DUAL SENSOR (WIRE) IMPLANT
GUIDEWIRE ZIPWRE .038 STRAIGHT (WIRE) ×1 IMPLANT
KIT TURNOVER KIT A (KITS) ×1 IMPLANT
MANIFOLD NEPTUNE II (INSTRUMENTS) ×1 IMPLANT
PACK CYSTO (CUSTOM PROCEDURE TRAY) ×1 IMPLANT
SHEATH NAVIGATOR HD 11/13X36 (SHEATH) IMPLANT
STENT URET 6FRX24 CONTOUR (STENTS) IMPLANT
STENT URET 6FRX26 CONTOUR (STENTS) IMPLANT
TUBING CONNECTING 10 (TUBING) ×1 IMPLANT
TUBING UROLOGY SET (TUBING) ×1 IMPLANT

## 2024-06-22 NOTE — Transfer of Care (Signed)
 Immediate Anesthesia Transfer of Care Note  Patient: Jill Riley  Procedure(s) Performed: CYSTOSCOPY/URETEROSCOPY/HOLMIUM LASER/STENT PLACEMENT/RETROGRADE (Left)  Patient Location: PACU  Anesthesia Type:General  Level of Consciousness: awake, alert , and oriented  Airway & Oxygen Therapy: Patient Spontanous Breathing and Patient connected to face mask oxygen  Post-op Assessment: Report given to RN and Post -op Vital signs reviewed and stable  Post vital signs: Reviewed and stable  Last Vitals:  Vitals Value Taken Time  BP 140/84 06/22/24 12:31  Temp    Pulse 95 06/22/24 12:34  Resp 18 06/22/24 12:34  SpO2 100 % 06/22/24 12:34  Vitals shown include unfiled device data.  Last Pain:  Vitals:   06/22/24 0923  TempSrc:   PainSc: 0-No pain         Complications: No notable events documented.

## 2024-06-22 NOTE — Op Note (Signed)
 Operative Note  Preoperative diagnosis:  1.  8 mm left UPJ stone  Postoperative diagnosis: 1.  8 mm left UPJ stone  Procedure(s): 1.  Cystoscopy with left ureteroscopy, holmium laser lithotripsy and left JJ stent placement 2.  Left retrograde pyelogram with intraoperative interpretation fluoroscopic imaging  Surgeon: Lonni Han, MD  Assistants:  None  Anesthesia:  General  Complications:  None  EBL: Less than 5 mL  Specimens: 1.  None  Drains/Catheters: 1.  Left 6 French, 24 cm JJ stent without tether  Intraoperative findings:   No intravesical or urethral abnormalities were seen Left retrograde pyelogram revealed a filling defect within the proximal aspects of the left ureter, consistent with the obstructing stone seen on recent CT.  No other ureteral or renal pelvis abnormalities were seen.  Indication:  Jill Riley is a 38 y.o. female with an obstructing 8 mm left UPJ stone.  She has been consented for the above procedures, voices understanding and wishes to proceed.  Description of procedure:  After informed consent was obtained, the patient was brought to the operating room and general LMA anesthesia was administered. The patient was then placed in the dorsolithotomy position and prepped and draped in the usual sterile fashion. A timeout was performed. A 21 French rigid cystoscope was then inserted into the urethral meatus and advanced into the bladder under direct vision. A complete bladder survey revealed no intravesical pathology.  A 5 French ureteral catheter was then inserted into the left ureteral orifice and a retrograde pyelogram was obtained, with the findings listed above.  A Glidewire was then used to intubate the lumen of the ureteral catheter and was advanced up to the left renal pelvis, under fluoroscopic guidance.  The catheter was then removed, leaving the wire in place.  A flexible ureteroscope was then advanced up the left  ureter and alongside the wire up to the level of her obstructing stone.  The stone was manipulated into a midpole calyx.  A 200 m holmium laser was then used to dust the stone into less than 2 mm fragments.  The flexible ureteroscope was then removed under direct vision, identifying no evidence of ureteral trauma or luminal stone burden.  A 6 French, 24 cm JJ stent was then placed over the Glidewire and into good position within the left collecting system, confirming placement via fluoroscopy.  The patient's bladder was drained.  She tolerated the procedure well and was transferred to the postanesthesia in stable condition.  Plan: Follow-up in 1 week for office cystoscopy and stent removal

## 2024-06-22 NOTE — Anesthesia Postprocedure Evaluation (Signed)
 Anesthesia Post Note  Patient: Jill Riley  Procedure(s) Performed: CYSTOSCOPY/URETEROSCOPY/HOLMIUM LASER/STENT PLACEMENT/RETROGRADE (Left)     Patient location during evaluation: PACU Anesthesia Type: General Level of consciousness: awake and alert Pain management: pain level controlled Vital Signs Assessment: post-procedure vital signs reviewed and stable Respiratory status: spontaneous breathing, nonlabored ventilation and respiratory function stable Cardiovascular status: blood pressure returned to baseline Postop Assessment: no apparent nausea or vomiting Anesthetic complications: no   No notable events documented.  Last Vitals:  Vitals:   06/22/24 1330 06/22/24 1400  BP: (!) 140/96 137/76  Pulse: 82 83  Resp: 14 13  Temp:  (!) 36 C  SpO2: 95% 96%    Last Pain:  Vitals:   06/22/24 1400  TempSrc:   PainSc: 3                  Vertell Row

## 2024-06-22 NOTE — Anesthesia Preprocedure Evaluation (Addendum)
 Anesthesia Evaluation  Patient identified by MRN, date of birth, ID band Patient awake    Reviewed: Allergy & Precautions, NPO status , Patient's Chart, lab work & pertinent test results  History of Anesthesia Complications Negative for: history of anesthetic complications  Airway Mallampati: II  TM Distance: >3 FB Neck ROM: Full    Dental  (+) Missing,    Pulmonary asthma    Pulmonary exam normal        Cardiovascular negative cardio ROS Normal cardiovascular exam     Neuro/Psych    Depression    negative neurological ROS     GI/Hepatic negative GI ROS, Neg liver ROS,,,  Endo/Other  BMI 39  Renal/GU Ureteral stone  negative genitourinary   Musculoskeletal negative musculoskeletal ROS (+)    Abdominal   Peds  Hematology negative hematology ROS (+)   Anesthesia Other Findings Day of surgery medications reviewed with patient.  Reproductive/Obstetrics negative OB ROS                              Anesthesia Physical Anesthesia Plan  ASA: 2  Anesthesia Plan: General   Post-op Pain Management: Tylenol  PO (pre-op)*   Induction: Intravenous  PONV Risk Score and Plan: 3 and Treatment may vary due to age or medical condition, Ondansetron , Dexamethasone , Midazolam  and Scopolamine  patch - Pre-op  Airway Management Planned: LMA  Additional Equipment: None  Intra-op Plan:   Post-operative Plan: Extubation in OR  Informed Consent: I have reviewed the patients History and Physical, chart, labs and discussed the procedure including the risks, benefits and alternatives for the proposed anesthesia with the patient or authorized representative who has indicated his/her understanding and acceptance.     Dental advisory given  Plan Discussed with: CRNA  Anesthesia Plan Comments:          Anesthesia Quick Evaluation

## 2024-06-22 NOTE — Anesthesia Procedure Notes (Signed)
 Procedure Name: LMA Insertion Date/Time: 06/22/2024 11:37 AM  Performed by: Zulema Leita PARAS, CRNAPre-anesthesia Checklist: Patient identified, Emergency Drugs available, Suction available and Patient being monitored Patient Re-evaluated:Patient Re-evaluated prior to induction Oxygen Delivery Method: Circle system utilized Preoxygenation: Pre-oxygenation with 100% oxygen Induction Type: IV induction LMA: LMA inserted LMA Size: 4.0 Number of attempts: 1 Placement Confirmation: positive ETCO2 and breath sounds checked- equal and bilateral Tube secured with: Tape Dental Injury: Teeth and Oropharynx as per pre-operative assessment

## 2024-06-23 ENCOUNTER — Encounter (HOSPITAL_COMMUNITY): Payer: Self-pay | Admitting: Urology

## 2024-10-05 ENCOUNTER — Encounter (HOSPITAL_COMMUNITY): Payer: Self-pay

## 2024-10-05 ENCOUNTER — Other Ambulatory Visit: Payer: Self-pay

## 2024-10-05 ENCOUNTER — Emergency Department (HOSPITAL_COMMUNITY)
Admission: EM | Admit: 2024-10-05 | Discharge: 2024-10-05 | Disposition: A | Discharge status: Home / Self Care | Attending: Emergency Medicine | Admitting: Emergency Medicine

## 2024-10-05 ENCOUNTER — Emergency Department (HOSPITAL_COMMUNITY)

## 2024-10-05 DIAGNOSIS — R509 Fever, unspecified: Secondary | ICD-10-CM | POA: Insufficient documentation

## 2024-10-05 DIAGNOSIS — N12 Tubulo-interstitial nephritis, not specified as acute or chronic: Secondary | ICD-10-CM | POA: Insufficient documentation

## 2024-10-05 DIAGNOSIS — R109 Unspecified abdominal pain: Secondary | ICD-10-CM | POA: Diagnosis present

## 2024-10-05 DIAGNOSIS — N200 Calculus of kidney: Secondary | ICD-10-CM | POA: Diagnosis not present

## 2024-10-05 DIAGNOSIS — J029 Acute pharyngitis, unspecified: Secondary | ICD-10-CM | POA: Insufficient documentation

## 2024-10-05 DIAGNOSIS — J45909 Unspecified asthma, uncomplicated: Secondary | ICD-10-CM | POA: Insufficient documentation

## 2024-10-05 LAB — CBC
HCT: 42.1 % (ref 36.0–46.0)
Hemoglobin: 14.4 g/dL (ref 12.0–15.0)
MCH: 31 pg (ref 26.0–34.0)
MCHC: 34.2 g/dL (ref 30.0–36.0)
MCV: 90.7 fL (ref 80.0–100.0)
Platelets: 288 K/uL (ref 150–400)
RBC: 4.64 MIL/uL (ref 3.87–5.11)
RDW: 12.1 % (ref 11.5–15.5)
WBC: 9.6 K/uL (ref 4.0–10.5)
nRBC: 0 % (ref 0.0–0.2)

## 2024-10-05 LAB — URINALYSIS, ROUTINE W REFLEX MICROSCOPIC
Bilirubin Urine: NEGATIVE
Glucose, UA: NEGATIVE mg/dL
Ketones, ur: NEGATIVE mg/dL
Nitrite: NEGATIVE
Protein, ur: NEGATIVE mg/dL
Specific Gravity, Urine: 1.021 (ref 1.005–1.030)
pH: 5 (ref 5.0–8.0)

## 2024-10-05 LAB — COMPREHENSIVE METABOLIC PANEL WITH GFR
ALT: 14 U/L (ref 0–44)
AST: 15 U/L (ref 15–41)
Albumin: 3.8 g/dL (ref 3.5–5.0)
Alkaline Phosphatase: 64 U/L (ref 38–126)
Anion gap: 8 (ref 5–15)
BUN: 14 mg/dL (ref 6–20)
CO2: 21 mmol/L — ABNORMAL LOW (ref 22–32)
Calcium: 9.1 mg/dL (ref 8.9–10.3)
Chloride: 107 mmol/L (ref 98–111)
Creatinine, Ser: 0.73 mg/dL (ref 0.44–1.00)
GFR, Estimated: >60 mL/min (ref >60)
Glucose, Bld: 109 mg/dL — ABNORMAL HIGH (ref 70–99)
Potassium: 3.9 mmol/L (ref 3.5–5.1)
Sodium: 136 mmol/L (ref 135–145)
Total Bilirubin: 0.6 mg/dL (ref 0.0–1.2)
Total Protein: 7.7 g/dL (ref 6.5–8.1)

## 2024-10-05 LAB — RESP PANEL BY RT-PCR (RSV, FLU A&B, COVID)  RVPGX2
Influenza A by PCR: NEGATIVE
Influenza B by PCR: NEGATIVE
Resp Syncytial Virus by PCR: NEGATIVE
SARS Coronavirus 2 by RT PCR: NEGATIVE

## 2024-10-05 LAB — HCG, SERUM, QUALITATIVE: Preg, Serum: NEGATIVE

## 2024-10-05 LAB — GROUP A STREP BY PCR: Group A Strep by PCR: NOT DETECTED

## 2024-10-05 LAB — LIPASE, BLOOD: Lipase: 27 U/L (ref 11–51)

## 2024-10-05 MED ORDER — HYDROCODONE-ACETAMINOPHEN 5-325 MG PO TABS: 1.0000 | ORAL_TABLET | ORAL | 0 refills | Status: DC | PRN

## 2024-10-05 MED ORDER — HYDROCODONE-ACETAMINOPHEN 5-325 MG PO TABS
2.0000 | ORAL_TABLET | Freq: Once | ORAL | Status: AC
  Administered 2024-10-05: 2 via ORAL
  Filled 2024-10-05: qty 2

## 2024-10-05 MED ORDER — IOHEXOL 350 MG/ML SOLN
75.0000 mL | Freq: Once | INTRAVENOUS | Status: AC | PRN
  Administered 2024-10-05: 75 mL via INTRAVENOUS

## 2024-10-05 MED ORDER — CEFPODOXIME PROXETIL 200 MG PO TABS: 200.0000 mg | ORAL_TABLET | Freq: Two times a day (BID) | ORAL | 0 refills | Status: DC

## 2024-10-05 NOTE — ED Triage Notes (Signed)
 Spanish interpreter used for triage.   Woke up this morning short of breath and used her prescribed inhaler.   Also c/o sore throat, subjective fevers, diarrhea, chronic nausea, and generalized not feeling well.   Lastly she feels a suspected mass on right mid-abdomen she says has never been present.

## 2024-10-05 NOTE — ED Provider Notes (Signed)
 [facility] Provider Note   CSN: [id number] Arrival date & time: 10/05/24  9491     Patient presents with: Abdominal Pain and Sore Throat   Jill Riley is a 38 y.o. female.   Patient complains of not feeling well overall.  She tells me that she woke up this a.m. with a sore throat.  Patient also felt short of breath.  Patient states he has asthma and this is not unusual.  Patient used her inhaler and the shortness of breath has resolved.  Patient states that she has pain on the right side of her abdomen.  She has a past medical history of kidney stones.  Patient states that this pain is different than previous kidney stone pain.  Patient states this pain is not as bad as what she is experienced with kidney stones.  Patient feels like she has had a fever.  Patient states she has not taken any medication.  Patient states her throat is sore with swallowing.  She denies any cough or congestion.  The history is provided by the patient and the spouse.  Abdominal Pain Pain location:  RLQ and R flank Pain quality: aching   Pain radiates to:  Does not radiate Pain severity:  Moderate Onset quality:  Gradual Duration:  1 day Timing:  Constant Progression:  Worsening Chronicity:  New Relieved by:  Nothing Worsened by:  Nothing Ineffective treatments:  None tried Associated symptoms: no vomiting   Sore Throat Associated symptoms include abdominal pain.       Prior to Admission medications   Medication Sig Start Date End Date Taking? Authorizing Provider  cefpodoxime (VANTIN) 200 MG tablet Take 1 tablet (200 mg total) by mouth 2 (two) times daily. 10/05/24  Yes Raistlin Gum K, PA-C  HYDROcodone -acetaminophen  (NORCO/VICODIN) 5-325 MG tablet Take 1 tablet by mouth every 4 (four) hours as needed. 10/05/24 10/05/25 Yes Flint Raring K, PA-C  cephALEXin  (KEFLEX ) 500 MG capsule Take 500 mg by mouth 2 (two) times daily. 06/16/24    [provider]  clotrimazole -betamethasone  (LOTRISONE ) cream Apply 1 Application topically 2 (two) times daily. Patient not taking: Reported on 06/20/2024 03/28/24   Chrzanowski, Jami B, NP  norethindrone  (ORTHO MICRONOR ) 0.35 MG tablet Take 1 tablet (0.35 mg total) by mouth daily. Patient not taking: Reported on 06/20/2024 03/28/24   Chrzanowski, Jami B, NP  ondansetron  (ZOFRAN -ODT) 4 MG disintegrating tablet Take 1 tablet (4 mg total) by mouth every 8 (eight) hours as needed for nausea or vomiting. Patient not taking: Reported on 06/20/2024 06/14/24   Neldon Hamp RAMAN, PA  oxybutynin  (DITROPAN ) 5 MG tablet Take 1 tablet (5 mg total) by mouth every 8 (eight) hours as needed for bladder spasms. 06/22/24   Devere Lonni Righter, MD  phenazopyridine  (PYRIDIUM ) 200 MG tablet Take 1 tablet (200 mg total) by mouth 3 (three) times daily as needed (for pain with urination). 06/22/24 06/22/25  Devere Lonni Righter, MD  tamsulosin  (FLOMAX ) 0.4 MG CAPS capsule Take 1 capsule (0.4 mg total) by mouth daily. 06/14/24   Neldon Hamp RAMAN, PA  famotidine  (PEPCID ) 20 MG tablet Take 1 tablet (20 mg total) by mouth 2 (two) times daily. Patient not taking: No sig reported 08/30/19 01/07/21  Hedges, Reyes, PA-C  omeprazole  (PRILOSEC) 20 MG capsule Take 1 capsule (20 mg total) by mouth daily. Patient not taking: No sig reported 08/30/19 01/07/21  Palmer Reyes, PA-C    Allergies: Patient has no known allergies.  Review of Systems  Gastrointestinal:  Positive for abdominal pain. Negative for vomiting.  All other systems reviewed and are negative.   Updated Vital Signs BP 130/84   Pulse 71   Temp 98.1 F (36.7 C) (Oral)   Resp 13   Ht 5' 3 (1.6 m)   Wt 99.8 kg   SpO2 100%   BMI 38.97 kg/m   Physical Exam Vitals and nursing note reviewed.  Constitutional:      Appearance: She is well-developed.  HENT:     Head: Normocephalic.  Eyes:     Extraocular Movements: Extraocular movements intact.      Pupils: Pupils are equal, round, and reactive to light.  Cardiovascular:     Rate and Rhythm: Normal rate.  Pulmonary:     Effort: Pulmonary effort is normal.  Abdominal:     General: Abdomen is flat. Bowel sounds are normal. There is no distension.     Palpations: Abdomen is soft.     Tenderness: There is generalized abdominal tenderness.  Musculoskeletal:        General: Normal range of motion.     Cervical back: Normal range of motion.  Skin:    General: Skin is warm.  Neurological:     General: No focal deficit present.     Mental Status: She is alert and oriented to person, place, and time.  Psychiatric:        Mood and Affect: Mood normal.     (all labs ordered are listed, but only abnormal results are displayed) Labs Reviewed  COMPREHENSIVE METABOLIC PANEL WITH GFR - Abnormal; Notable for the following components:      Result Value   CO2 21 (*)    Glucose, Bld 109 (*)    All other components within normal limits  URINALYSIS, ROUTINE W REFLEX MICROSCOPIC - Abnormal; Notable for the following components:   APPearance CLOUDY (*)    Hgb urine dipstick SMALL (*)    Leukocytes,Ua LARGE (*)    Bacteria, UA FEW (*)    All other components within normal limits  RESP PANEL BY RT-PCR (RSV, FLU A&B, COVID)  RVPGX2  GROUP A STREP BY PCR  URINE CULTURE  LIPASE, BLOOD  CBC  HCG, SERUM, QUALITATIVE    EKG: None  Radiology: CT ABDOMEN PELVIS W CONTRAST Result Date: 10/05/2024 CLINICAL DATA:  Abdominal pain. Woke up this morning with shortness of breath. Also sore throat with possible fevers, diarrhea, chronic nausea and not feeling well. Possible right abdominal mass. EXAM: CT ABDOMEN AND PELVIS WITH CONTRAST TECHNIQUE: Multidetector CT imaging of the abdomen and pelvis was performed using the standard protocol following bolus administration of intravenous contrast. RADIATION DOSE REDUCTION: This exam was performed according to the departmental dose-optimization program which  includes automated exposure control, adjustment of the mA and/or kV according to patient size and/or use of iterative reconstruction technique. CONTRAST:  75mL OMNIPAQUE  IOHEXOL  350 MG/ML SOLN COMPARISON:  06/14/2024 FINDINGS: Lower chest: Heart is normal size.  Visualized lung bases are clear. Hepatobiliary: Subcentimeter hypodensity over the right lobe of the liver unchanged too small to characterize but likely a cyst. Remainder of the liver, gallbladder and biliary tree are normal. Pancreas: Normal. Spleen: Normal. Adrenals/Urinary Tract: Adrenal glands are normal. Kidneys are normal in size with mild bilateral nephrolithiasis. No hydronephrosis. Patchy ill-defined low attenuation over the lower pole left kidney which can be seen with pyelonephritis. No perinephric inflammation or fluid. Ureters and bladder are normal. Stomach/Bowel: Stomach and small bowel are normal.  Moderate diverticulosis of the colon without active inflammation. Vascular/Lymphatic: Abdominal aorta is normal in caliber. Remaining vascular structures are unremarkable. No adenopathy. Reproductive: Uterus and bilateral adnexa are unremarkable. Other: No free fluid or focal inflammatory change. Musculoskeletal: No focal abnormality. IMPRESSION: 1. Patchy ill-defined low attenuation over the lower pole left kidney which can be seen with pyelonephritis. Recommend correlation with urinalysis. 2. Mild bilateral nephrolithiasis. 3. Moderate diverticulosis of the colon without active inflammation. Electronically Signed   By: Toribio Agreste M.D.   On: 10/05/2024 08:30     Procedures   Medications Ordered in the ED  HYDROcodone -acetaminophen  (NORCO/VICODIN) 5-325 MG per tablet 2 tablet (2 tablets Oral Given 10/05/24 0710)  iohexol  (OMNIPAQUE ) 350 MG/ML injection 75 mL (75 mLs Intravenous Contrast Given 10/05/24 0820)                                    Medical Decision Making Patient complains of right flank and right lower quadrant  abdominal pain.  Amount and/or Complexity of Data Reviewed Independent Historian: spouse    Details: Patient is here with husband who is supportive External Data Reviewed: notes.    Details: Previous notes reviewed.  Patient has history of kidney stones Labs: ordered. Decision-making details documented in ED Course.    Details: Labs ordered reviewed and interpreted strep is negative influenza COVID and RSV are negative.  UA shows large leukocytes with 21-50 white blood cells Radiology: ordered and independent interpretation performed. Decision-making details documented in ED Course.    Details: CT abdomen and pelvis with contrast shows patchy ill-defined low-attenuation over the lower pole of left kidney which can be seen with pyelonephritis.  Mild bilateral nephrolithiasis.  Moderate diverticulosis without inflammation.  Risk Prescription drug management. Risk Details: Patient counseled on results.  Patient is given a prescription for Vantin twice daily for 10 days.  Patient is advised to schedule with her urologist for recheck.  Return if any problems.        Final diagnoses:  None    ED Discharge Orders          Ordered    cefpodoxime (VANTIN) 200 MG tablet  2 times daily        10/05/24 0855    HYDROcodone -acetaminophen  (NORCO/VICODIN) 5-325 MG tablet  Every 4 hours PRN        10/05/24 0856            An After Visit Summary was printed and given to the patient.    Flint Sonny POUR, [place] 10/05/24 1434    Jerrol Agent, MD 10/05/24 [phone]

## 2024-10-05 NOTE — Discharge Instructions (Addendum)
 Follow up with your Urologist for recheck.  Return if any problems.

## 2024-10-05 NOTE — ED Notes (Signed)
 Spanish interpreter used. Patient verbalizes understanding of discharge instructions. Opportunity for questioning and answers were provided. Pt discharged from ED.

## 2024-10-06 LAB — URINE CULTURE
Culture: <10000 — AB
Special Requests: NORMAL

## 2024-10-20 ENCOUNTER — Encounter: Payer: Self-pay | Admitting: Student in an Organized Health Care Education/Training Program

## 2024-10-20 ENCOUNTER — Ambulatory Visit: Admitting: Student in an Organized Health Care Education/Training Program

## 2024-10-20 VITALS — BP 113/90 | HR 85 | Ht 63.0 in | Wt 209.0 lb

## 2024-10-20 DIAGNOSIS — Z114 Encounter for screening for human immunodeficiency virus [HIV]: Secondary | ICD-10-CM

## 2024-10-20 DIAGNOSIS — Z1159 Encounter for screening for other viral diseases: Secondary | ICD-10-CM | POA: Diagnosis not present

## 2024-10-20 DIAGNOSIS — F411 Generalized anxiety disorder: Secondary | ICD-10-CM | POA: Diagnosis not present

## 2024-10-20 DIAGNOSIS — E66812 Obesity, class 2: Secondary | ICD-10-CM | POA: Insufficient documentation

## 2024-10-20 DIAGNOSIS — R0789 Other chest pain: Secondary | ICD-10-CM | POA: Insufficient documentation

## 2024-10-20 DIAGNOSIS — Z131 Encounter for screening for diabetes mellitus: Secondary | ICD-10-CM | POA: Diagnosis not present

## 2024-10-20 DIAGNOSIS — Z13220 Encounter for screening for lipoid disorders: Secondary | ICD-10-CM

## 2024-10-20 LAB — HEMOGLOBIN A1C: Hgb A1c MFr Bld: 5.6 % (ref 4.6–6.5)

## 2024-10-20 MED ORDER — DULOXETINE HCL 30 MG PO CPEP: 30.0000 mg | ORAL_CAPSULE | Freq: Every day | ORAL | 1 refills | Status: DC

## 2024-10-20 NOTE — Progress Notes (Signed)
 New Patient Office Visit  Patient ID: Naquita Nappier, Female   DOB: [birth year] 38 y.o. MRN: [id number]  Chief Complaint  Patient presents with   Establish Care         Mass    Right side next to the ribs. Sx started 1 week ago. Painful. No redness or draining.    Anxiety    Patient has panic attacks but she is not taking any medication for it. When she is very stressed and has inflammation around her neck   Depression    Would like to start medications for anxiety and depression   Subjective:     Maija Nat [name] Pagan presents to establish care  HPI  Discussed the use of AI scribe software for clinical note transcription with the patient, who gave verbal consent to proceed.  History of Present Illness Terilyn Sameeha Rockefeller is a 38 year old female who presents with a concern about a lump on her right side.  She recently experienced an asthma attack that required a hospital visit. During this visit, she noticed a lump on the right side of her body. The area feels hot and bothersome when pressed. She recalls being told after an MRI that it might be a cyst, but she is unsure of the details.  She has a history of kidney stones and underwent a procedure two months ago involving a tube placement, which led to an infection that she believes is now resolved. She was prescribed antibiotics for a urinary infection, but after a follow-up with her urologist, she was told there was no infection, so she stopped taking the medication.  She experiences significant stress and anxiety, attributing it to past events, including her mother's heart attack five years ago. She reports frequent panic attacks, especially at night, causing her heart to race and her body to feel electrified. She also experiences nausea and poor sleep, feeling tired all the time.  She has a history of taking over-the-counter weight loss pills, which caused diarrhea for three weeks. She stopped  taking them, and her bowel movements have normalized. She also reports occasional high blood pressure and frequent migraines, describing them as feeling like her eyes are 'popping out'.  She lives with her husband, four children, and her mother. 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 She has been offered medical marijuana in the past but declined due to personal preference.   Outpatient Encounter Medications as of 10/20/2024  Medication Sig   DULoxetine (CYMBALTA) 30 MG capsule Take 1 capsule (30 mg total) by mouth daily.   [DISCONTINUED] cefpodoxime (VANTIN) 200 MG tablet Take 1 tablet (200 mg total) by mouth 2 (two) times daily. (Patient not taking: Reported on 10/20/2024)   [DISCONTINUED] cephALEXin  (KEFLEX ) 500 MG capsule Take 500 mg by mouth 2 (two) times daily. (Patient not taking: Reported on 10/20/2024)   [DISCONTINUED] clotrimazole -betamethasone  (LOTRISONE ) cream Apply 1 Application topically 2 (two) times daily. (Patient not taking: Reported on 10/20/2024)   [DISCONTINUED] famotidine  (PEPCID ) 20 MG tablet Take 1 tablet (20 mg total) by mouth 2 (two) times daily. (Patient not taking: No sig reported)   [DISCONTINUED] HYDROcodone -acetaminophen  (NORCO/VICODIN) 5-325 MG tablet Take 1 tablet by mouth every 4 (four) hours as needed. (Patient not taking: Reported on 10/20/2024)   [DISCONTINUED] norethindrone  (ORTHO MICRONOR ) 0.35 MG tablet Take 1 tablet (0.35 mg total) by mouth daily. (Patient not taking: Reported on 10/20/2024)   [DISCONTINUED] omeprazole  (PRILOSEC)  20 MG capsule Take 1 capsule (20 mg total) by mouth daily. (Patient not taking: No sig reported)   [DISCONTINUED] ondansetron  (ZOFRAN -ODT) 4 MG disintegrating tablet Take 1 tablet (4 mg total) by mouth every 8 (eight) hours as needed for nausea or vomiting. (Patient not taking: Reported on 10/20/2024)   [DISCONTINUED] oxybutynin  (DITROPAN ) 5 MG tablet Take 1 tablet (5 mg total) by  mouth every 8 (eight) hours as needed for bladder spasms. (Patient not taking: Reported on 10/20/2024)   [DISCONTINUED] phenazopyridine  (PYRIDIUM ) 200 MG tablet Take 1 tablet (200 mg total) by mouth 3 (three) times daily as needed (for pain with urination). (Patient not taking: Reported on 10/20/2024)   [DISCONTINUED] tamsulosin  (FLOMAX ) 0.4 MG CAPS capsule Take 1 capsule (0.4 mg total) by mouth daily. (Patient not taking: Reported on 10/20/2024)   No facility-administered encounter medications on file as of 10/20/2024.    Past Medical History:  Diagnosis Date   Asthma    Depression    Dysuria    Flank pain    History of kidney stones    Left ureteral calculus    residual stone s/p  1st stage laser lithotripsy 07-15-2017   Retained ureteral stent    right side   Wears glasses     Past Surgical History:  Procedure Laterality Date   CYSTOSCOPY W/ URETERAL STENT REMOVAL Bilateral 07/29/2017   Procedure: CYSTOSCOPY WITH STENT REMOVAL;  Surgeon: Alvaro Hummer, MD;  Location: [facility];  Service: Urology;  Laterality: Bilateral;   CYSTOSCOPY WITH RETROGRADE PYELOGRAM, URETEROSCOPY AND STENT PLACEMENT Bilateral 07/15/2017   Procedure: CYSTOSCOPY WITH bilateral RETROGRADE PYELOGRAM, right URETEROSCOPY, left diagnostic ureteroscopy AND STENT PLACEMENT;  Surgeon: Alvaro Hummer, MD;  Location[facility];  Service: Urology;  Laterality: Bilateral;   CYSTOSCOPY WITH RETROGRADE PYELOGRAM, URETEROSCOPY AND STENT PLACEMENT Left 07/29/2017   Procedure: CYSTOSCOPY WITH RETROGRADE PYELOGRAM, URETEROSCOPY AND STENT REPLACEMENT LEFT URETER;  Surgeon: Alvaro Hummer, MD;  Location: [facility];  Service: Urology;  Laterality: Left;   CYSTOSCOPY/URETEROSCOPY/HOLMIUM LASER/STENT PLACEMENT Left 06/22/2024   Procedure: CYSTOSCOPY/URETEROSCOPY/HOLMIUM LASER/STENT PLACEMENT/RETROGRADE;  Surgeon: Devere Lonni Righter, MD;  Location: [facility];  Service: Urology;   Laterality: Left;  CYSTOSCOPY/LEFT URETEROSCOPY/HOLMIUM LASER/STENT PLACEMENT/RETROGRADE PYELOGRAM   HOLMIUM LASER APPLICATION Bilateral 07/15/2017   Procedure: HOLMIUM LASER APPLICATION;  Surgeon: Alvaro Hummer, MD;  Location: [facility];  Service: Urology;  Laterality: Bilateral;   HOLMIUM LASER APPLICATION Left 07/29/2017   Procedure: HOLMIUM LASER APPLICATION;  Surgeon: Alvaro Hummer, MD;  Location: [facility];  Service: Urology;  Laterality: Left;   TENDON REPAIR Left 04/21/2017   Procedure: LEFT THUMB REPAIR EXTENSOR  TENDON;  Surgeon: Murrell Drivers, MD;  Location: [facility];  Service: Orthopedics;  Laterality: Left;   TUBAL LIGATION Bilateral 2013   [place]   had procedure to not have children any more    Family History  Problem Relation Age of Onset   Hypertension Mother    Breast cancer Maternal Aunt        Objective:    BP (!) 113/90 (BP Location: Left Arm, Patient Position: Sitting, Cuff Size: Large)   Pulse 85   Ht 5' 3 (1.6 m)   Wt 209 lb (94.8 kg)   BMI 37.02 kg/m   Physical Exam  Gen: Well-appearing woman Eyes: Normal Neck: No increased circumference, a few skin tags scattered, no adenopathy or nodules Heart: Regular, no murmur Lungs: Unlabored, clear throughout Chest: She has concerns about discomfort at  the right side of her chest/flank about the level of the costophrenic angle.  On palpation the soft tissue feels normal, there is no subcutaneous mass palpated, no cyst or nodules.  The skin is normal with no erythema, ecchymosis, or vesicles. Abd: Soft, nontender, obese Ext: Warm, no edema Psych: Very anxious appearing, moderately depressed appearing, speech is mildly pressured, thoughts are organized Neuro: Alert, conversational, full strength upper and lower extremities, normal gait and balance      Assessment & Plan:   Problem List Items Addressed This Visit       High   Generalized anxiety  disorder - Primary (Chronic)   History and exam are consistent with generalized anxiety disorder with moderate symptom burden.  It is associated with difficulty sleeping and diffuse somatic discomfort.  We talked about options for medication assisted treatments.  She has never tried an SSRI before.  No history of bipolar disease.  Low risk for self-harm.  Will start with Cymbalta 30 mg once daily.  We talked about possible side effects, and encouraged consistent use over the next 4 weeks.  Follow-up with me in 4 weeks and can discuss up titration or adding something specifically for sleep like trazodone if needed.      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  Weight today 209 pounds with a BMI of 37.  She is interested in weight loss, has tried different dietary supplements but had side effects.  Will need to work on lifestyle modifications, changes in nutrition.  Will check labs today to look for complications of obesity like diabetes or hyperlipidemia.        Low   Rib pain on right side (Chronic)   Discomfort on her right side seems to be benign.  Her exam is reassuring with no subcutaneous nodules or masses.  Skin exam is reassuring, no signs of zoster.  She denies any trauma or abuse.  I reviewed the CT scan from 2 weeks ago of the abdomen, the soft tissue on the right side where she has discomfort appears normal.  We are going to treat her anxiety, I think this issue has a good chance of improving with supportive care over the coming days-weeks.      Other Visit Diagnoses       Screening for lipid disorders       Relevant Orders   Lipid panel     Screening for diabetes mellitus       Relevant Orders   Hemoglobin A1c     Encounter for HCV screening test for low risk patient       Relevant Orders   Hepatitis C antibody     Screening for HIV (human immunodeficiency virus)        Relevant Orders   HIV Antibody (routine testing w rflx)      Return in about 4 weeks (around 11/17/2024).   Cleatus Debby Specking, MD [facility]

## 2024-10-20 NOTE — Assessment & Plan Note (Signed)
 Chronic issue.  Weight today 209 pounds with a BMI of 37.  She is interested in weight loss, has tried different dietary supplements but had side effects.  Will need to work on lifestyle modifications, changes in nutrition.  Will check labs today to look for complications of obesity like diabetes or hyperlipidemia.

## 2024-10-20 NOTE — Assessment & Plan Note (Signed)
 Discomfort on her right side seems to be benign.  Her exam is reassuring with no subcutaneous nodules or masses.  Skin exam is reassuring, no signs of zoster.  She denies any trauma or abuse.  I reviewed the CT scan from 2 weeks ago of the abdomen, the soft tissue on the right side where she has discomfort appears normal.  We are going to treat her anxiety, I think this issue has a good chance of improving with supportive care over the coming days-weeks.

## 2024-10-20 NOTE — Assessment & Plan Note (Signed)
 History and exam are consistent with generalized anxiety disorder with moderate symptom burden.  It is associated with difficulty sleeping and diffuse somatic discomfort.  We talked about options for medication assisted treatments.  She has never tried an SSRI before.  No history of bipolar disease.  Low risk for self-harm.  Will start with Cymbalta 30 mg once daily.  We talked about possible side effects, and encouraged consistent use over the next 4 weeks.  Follow-up with me in 4 weeks and can discuss up titration or adding something specifically for sleep like trazodone if needed.

## 2024-10-20 NOTE — Patient Instructions (Signed)
  VISIT SUMMARY: Today, we discussed your concern about a lump on your right side, your recent asthma attack, and your ongoing issues with anxiety, stress, and insomnia. We also reviewed your history of kidney stones, past infections, and other health concerns.  YOUR PLAN: -ANXIETY DISORDER WITH INSOMNIA: Anxiety disorder with insomnia means you experience chronic anxiety and have trouble sleeping, which can be worsened by stress and panic attacks. We have prescribed Cymbalta, which you should take once daily. Be aware of potential side effects like an upset stomach and lightheadedness. It may take two weeks to notice any effects. We will follow up in one month to see how you are responding and adjust the dosage if needed.  INSTRUCTIONS: Please schedule a follow-up appointment in one month to assess your response to the medication and adjust the dosage if necessary.

## 2024-10-21 LAB — LIPID PANEL
Cholesterol: 211 mg/dL — ABNORMAL HIGH (ref 0–200)
HDL: 39.2 mg/dL (ref >39.00)
LDL Cholesterol: 144 mg/dL — ABNORMAL HIGH (ref 0–99)
NonHDL: 172.22
Total CHOL/HDL Ratio: 5
Triglycerides: 141 mg/dL (ref 0.0–149.0)
VLDL: 28.2 mg/dL (ref 0.0–40.0)

## 2024-10-21 LAB — HIV ANTIBODY (ROUTINE TESTING W REFLEX)
HIV 1&2 Ab, 4th Generation: NONREACTIVE
HIV FINAL INTERPRETATION: NEGATIVE

## 2024-10-21 LAB — HEPATITIS C ANTIBODY: Hepatitis C Ab: NONREACTIVE

## 2024-10-21 LAB — TSH: TSH: 1.2 u[IU]/mL (ref 0.35–5.50)

## 2024-10-24 ENCOUNTER — Ambulatory Visit: Payer: Self-pay | Admitting: Student in an Organized Health Care Education/Training Program

## 2024-11-17 ENCOUNTER — Ambulatory Visit: Admitting: Student in an Organized Health Care Education/Training Program

## 2024-11-17 ENCOUNTER — Encounter: Payer: Self-pay | Admitting: Student in an Organized Health Care Education/Training Program

## 2024-11-17 VITALS — BP 129/90 | HR 67 | Temp 98.2°F | Resp 18 | Ht 63.0 in | Wt 206.2 lb

## 2024-11-17 DIAGNOSIS — F411 Generalized anxiety disorder: Secondary | ICD-10-CM

## 2024-11-17 DIAGNOSIS — E782 Mixed hyperlipidemia: Secondary | ICD-10-CM | POA: Diagnosis not present

## 2024-11-17 DIAGNOSIS — M545 Low back pain, unspecified: Secondary | ICD-10-CM

## 2024-11-17 DIAGNOSIS — E785 Hyperlipidemia, unspecified: Secondary | ICD-10-CM | POA: Insufficient documentation

## 2024-11-17 DIAGNOSIS — G8929 Other chronic pain: Secondary | ICD-10-CM | POA: Insufficient documentation

## 2024-11-17 MED ORDER — CYCLOBENZAPRINE HCL 5 MG PO TABS: 5.0000 mg | ORAL_TABLET | Freq: Every day | ORAL | 1 refills | Status: AC | PRN

## 2024-11-17 NOTE — Assessment & Plan Note (Signed)
 New problem of low back pain.  This has been an intermittent chronic issue for many years.  This has resulted in emergency department visits in the past, but nothing recently.  The discomfort is mostly midline around the sacrum and the low back.  No radiculopathy.  No high risk symptoms.  No red flags.  It is an intermittent issue.  Symptoms have improved in the past using Flexeril .  She gets some side effect of drowsiness.  I recommended lifestyle changes including weight loss.  She is doing a good job exercising.  Still very functional.  Will refill Flexeril  5 mg to be used as needed for discomfort.  I prescribed 30 tablets and would expect that to last about 3 months.  Follow-up with me in 6 months.  If pain worsens in the future, would recommend physical therapy.

## 2024-11-17 NOTE — Assessment & Plan Note (Signed)
 Chronic and stable.  We discussed the results of the cholesterol at last visit.  Likely related to metabolic syndrome and obesity.  ASCVD risk still very low given her young age and lack of other comorbidities.  We talked about lifestyle modifications including weight loss.  Will recheck lipids in 2 years.  Low value to statins right now.

## 2024-11-17 NOTE — Assessment & Plan Note (Signed)
 Chronic issue, much improved after starting Cymbalta  30 mg daily.  No side effects to that medicine.  Will plan to continue.  Follow-up in 6 months.

## 2024-11-17 NOTE — Progress Notes (Signed)
 Established Patient Office Visit  Subjective   Patient ID: Jill Riley, female    DOB: 14-Oct-1986  Age: 38 y.o. MRN: [id number]  Chief Complaint  Patient presents with   Anxiety   Follow-up    HPI  Patient is here for follow-up of anxiety.  We started duloxetine  1 month ago because of generalized anxiety disorder.  Tolerating that medication well.  Reports good benefit.  Her anxiety is much better controlled.  Denies any depressed mood.  Family is doing well.  No new stressors.  Very functional.  Has an issue today of chronic low back pain.  This has been something going on for many years and has been intermittent.  She reports the discomfort in her low back.  Worsened with activity and exertion.  Sometimes limits her from exercise.  No radiation down the leg.  No weakness or falls or trauma.  No fevers or chills.  No changes in bowel or bladder.  No personal history of cancer.  Has used Flexeril  in the past, prescribed for this, and reports good benefit.  Does make her drowsy though.    Objective:     BP (!) 129/90 (BP Location: Left Arm, Patient Position: Sitting, Cuff Size: Large)   Pulse 67   Temp 98.2 F (36.8 C) (Oral)   Resp 18   Ht 5' 3 (1.6 m)   Wt 206 lb 3.2 oz (93.5 kg)   SpO2 100%   BMI 36.53 kg/m   Physical Exam  Gen: Well-appearing woman Heart: Regular, no murmur Lungs: Unlabored, clear throughout Abd: Soft, nontender, obese Ext: Warm, no edema Neuro: Alert, conversational, normal get up and go, full strength upper and lower extremities, normal patellar reflexes bilaterally, normal sensation throughout the legs, no active back pain today.  Good range of motion.    Assessment & Plan:   Problem List Items Addressed This Visit       High   Generalized anxiety disorder - Primary (Chronic)   Chronic issue, much improved after starting Cymbalta  30 mg daily.  No side effects to that medicine.  Will plan to continue.  Follow-up in 6  months.        Medium    Hyperlipidemia (Chronic)   Chronic and stable.  We discussed the results of the cholesterol at last visit.  Likely related to metabolic syndrome and obesity.  ASCVD risk still very low given her young age and lack of other comorbidities.  We talked about lifestyle modifications including weight loss.  Will recheck lipids in 2 years.  Low value to statins right now.      Chronic low back pain (Chronic)   New problem of low back pain.  This has been an intermittent chronic issue for many years.  This has resulted in emergency department visits in the past, but nothing recently.  The discomfort is mostly midline around the sacrum and the low back.  No radiculopathy.  No high risk symptoms.  No red flags.  It is an intermittent issue.  Symptoms have improved in the past using Flexeril .  She gets some side effect of drowsiness.  I recommended lifestyle changes including weight loss.  She is doing a good job exercising.  Still very functional.  Will refill Flexeril  5 mg to be used as needed for discomfort.  I prescribed 30 tablets and would expect that to last about 3 months.  Follow-up with me in 6 months.  If pain worsens in the future, would recommend  physical therapy.      Relevant Medications   cyclobenzaprine  (FLEXERIL ) 5 MG tablet    Return in about 6 months (around 05/18/2025).    Cleatus Debby Specking, MD

## 2024-12-20 ENCOUNTER — Ambulatory Visit: Payer: Self-pay

## 2024-12-20 NOTE — Telephone Encounter (Signed)
 FYI Only or Action Required?: Action required by provider: ED advised. Pt would like to be called to schedule hospital f/u visit after she goes to ED today.  Patient was last seen in primary care on 11/17/2024 by Jerrell Cleatus Ned, MD.  Called Nurse Triage reporting Abdominal Pain.  Symptoms began several days ago.  Interventions attempted: Rest, hydration, or home remedies.  Symptoms are: gradually worsening.  Triage Disposition: Go to ED Now (Notify PCP)  Patient/caregiver understands and will follow disposition?: Yes    Spoke with pts daughter Jill Riley. There with pt now/interpreting. Started having mid abdominal pain after starting new rx for cymbalta  2 months ago. Was mild and has gradually built up over the past 2 months. Became severe several days ago. Now 9/10, constant dull aching pain. Had to stop driving the other day d/t the pain. Also with nausea with dizziness. Emesis x1 this morning. No blood. Triage cut short d/t severity of pt symptoms. Advised ED, agreeable to go and daughter will drive her. Advised 911 for worsening symptoms.      Copied from CRM [id number]. Topic: Clinical - Red Word Triage >> Dec 20, 2024 11:33 AM Jill Riley wrote: [facility] that prompted transfer to Nurse Triage: Patient is having extreme stomach pain due to medication. Patient described the pain as unbearable. (Patient does not know the name of medication) Reason for Disposition  [1] SEVERE pain (e.g., excruciating) AND [2] present > 1 hour  Answer Assessment - Initial Assessment Questions 1. LOCATION: Where does it hurt?      Mid abdomen 2. RADIATION: Does the pain shoot anywhere else? (e.g., chest, back)     *No Answer* 3. ONSET: When did the pain begin? (e.g., minutes, hours or days ago)      Gradual x2 months, then suddenly severe over the past few days 4. SUDDEN: Gradual or sudden onset?     Gradual x2 months, then suddenly severe over the past few days 5. PATTERN Does the pain  come and go, or is it constant?     Now constant 6. SEVERITY: How bad is the pain?  (e.g., Scale 1-10; mild, moderate, or severe)     9/10 7. RECURRENT SYMPTOM: Have you ever had this type of stomach pain before? If Yes, ask: When was the last time? and What happened that time?      *No Answer* 8. CAUSE: What do you think is causing the stomach pain? (e.g., gallstones, recent abdominal surgery)     Cymbalta  9. RELIEVING/AGGRAVATING FACTORS: What makes it better or worse? (e.g., antacids, bending or twisting motion, bowel movement)     *No Answer* 10. OTHER SYMPTOMS: Do you have any other symptoms? (e.g., back pain, diarrhea, fever, urination pain, vomiting)       Nausea, emesis, dizziness 11. PREGNANCY: Is there any chance you are pregnant? When was your last menstrual period?       Denies  Protocols used: Abdominal Pain - Female-A-AH

## 2024-12-23 ENCOUNTER — Encounter: Payer: Self-pay | Admitting: Student in an Organized Health Care Education/Training Program

## 2024-12-23 ENCOUNTER — Ambulatory Visit: Admitting: Student in an Organized Health Care Education/Training Program

## 2024-12-23 VITALS — BP 148/103 | HR 84

## 2024-12-23 DIAGNOSIS — R1013 Epigastric pain: Secondary | ICD-10-CM | POA: Diagnosis not present

## 2024-12-23 DIAGNOSIS — F411 Generalized anxiety disorder: Secondary | ICD-10-CM | POA: Diagnosis not present

## 2024-12-23 DIAGNOSIS — R109 Unspecified abdominal pain: Secondary | ICD-10-CM | POA: Insufficient documentation

## 2024-12-23 MED ORDER — PANTOPRAZOLE SODIUM 40 MG PO TBEC: 40.0000 mg | DELAYED_RELEASE_TABLET | Freq: Every day | ORAL | 3 refills | Status: AC

## 2024-12-23 MED ORDER — HYDROXYZINE PAMOATE 25 MG PO CAPS: 25.0000 mg | ORAL_CAPSULE | Freq: Three times a day (TID) | ORAL | 1 refills | Status: AC | PRN

## 2024-12-23 NOTE — Patient Instructions (Signed)
" °  VISIT SUMMARY: During your visit, we discussed your severe upper abdominal pain, which has been ongoing for the past two weeks and has caused faintness, vomiting, and other symptoms. We also addressed your anxiety and its potential impact on your symptoms.  YOUR PLAN: -EPIGASTRIC PAIN: Epigastric pain refers to pain in the upper abdomen. We are considering several possible causes, including medication side effects, reflux, and gallbladder issues. We have ordered blood work and an ultrasound of your gallbladder to investigate further. In the meantime, you are prescribed Protonix  to take once daily, and you should temporarily stop taking Cymbalta . We will follow up in four weeks to review your progress.  -GENERALIZED ANXIETY DISORDER: Generalized anxiety disorder is a condition characterized by excessive worry and anxiety. Your anxiety may be contributing to your nausea and abdominal pain. While Cymbalta  is effective for managing anxiety, it may also be causing stomach pain. You are prescribed hydroxyzine  to take as needed for acute anxiety symptoms. Once your abdominal symptoms improve, we will consider restarting Cymbalta .  INSTRUCTIONS: Please complete the blood work and ultrasound of your gallbladder as soon as possible. Take Protonix  once daily and temporarily stop taking Cymbalta . Use hydroxyzine  as needed for acute anxiety symptoms. We will follow up in four weeks to review your progress and adjust your treatment plan as necessary.  "

## 2024-12-23 NOTE — Assessment & Plan Note (Addendum)
 She experiences intermittent severe epigastric pain with vomiting and near syncope. The differential diagnosis includes medication side effects, reflux, and gallbladder disease.  Exam is reassuring today, I doubt this is acute cholecystitis.  Cymbalta  is unlikely to cause severe pain, but anxiety may contribute.  She is of the right demographic for biliary colic or choledocholithiasis. Blood work and an ultrasound of the gallbladder are ordered. Prescribed Protonix  1 tablet once daily and temporarily hold Cymbalta . Schedule a follow-up in four weeks.  Patient counseled if she were to develop worsening nausea, vomiting, food intolerance, to go to the emergency department for hydration.  I am somewhat reassured that she had a CT scan done just 3 months ago in the emergency department for abdominal pain which was structurally normal.

## 2024-12-23 NOTE — Progress Notes (Signed)
 "  Acute Office Visit  Patient ID: Jill Riley, female    DOB: 02/11/1986, 39 y.o.   MRN: [id number]  PCP: Jerrell Cleatus Ned, MD  Chief Complaint  Patient presents with   Medical Management of Chronic Issues    Medication discussion     Subjective:     HPI  Discussed the use of AI scribe software for clinical note transcription with the patient, who gave verbal consent to proceed.  History of Present Illness Jill Riley is a 39 year old female who presents with severe abdominal pain. She is accompanied by her 80 year old daughter.  She has been experiencing severe abdominal pain located in the upper abdomen for the past two weeks. The pain is intense and intermittent, causing faintness and episodes of vomiting. It is not influenced by eating, and she initially delayed seeking medical attention, thinking it was normal.  The pain has been severe enough to prevent her from driving due to faintness. This morning, she experienced a particularly severe episode with vomiting more than five times, chills, dizziness, and tingling in her face. Her lips swelled quickly during this episode, and she has not eaten today due to pain and nausea.  She has a history of anxiety and depression and was started on Cymbalta  a month ago. Initially, the medication helped, but she stopped taking it when the abdominal pain began, suspecting a connection. She took the medication again last night due to anxiety, which she believes may have triggered this morning's episode.  She has a history of kidney stones, with stones removed twice, most recently about four months ago. She reports persistent nausea since then. No history of abdominal surgeries.  Her daughter, who lives with her, notes significant stress and poor eating habits due to the pain. The daughter is concerned about her mother's mental health and the impact of her own potential departure for the [employer].       Objective:    BP (!) 148/103   Pulse 84   Physical Exam  Gen: Anxious appearing woman, tearful at times Neck: Normal thyroid , no nodules or adenopathy Heart: Regular, no murmur Lungs: Unlabored, clear throughout Abd: Soft, mild tenderness in the epigastrium, nontender throughout the rest, no rebound or guarding, no organomegaly or hernias. Ext: Warm, no edema  EKG: Obtained due to acute onset epigastric and lower chest discomforts.  EKG is normal sinus rhythm, normal intervals, no Q waves, no ST or T wave changes.      Assessment & Plan:   Problem List Items Addressed This Visit       High   Generalized anxiety disorder (Chronic)   Anxiety may contribute to nausea and epigastric pain.  Her symptoms this morning sound very consistent with a panic attack.  EKG is reassuring.  Cymbalta  is effective for anxiety but has been holding it for 2 weeks because of nausea. Prescribed hydroxyzine  as needed for acute symptoms. Plan to restart Cymbalta  once symptoms improve.  If she cannot tolerate going back to Cymbalta , we could try a different SSRI.      Relevant Medications   hydrOXYzine  (VISTARIL ) 25 MG capsule     Unprioritized   Abdominal pain - Primary   She experiences intermittent severe epigastric pain with vomiting and near syncope. The differential diagnosis includes medication side effects, reflux, and gallbladder disease.  Exam is reassuring today, I doubt this is acute cholecystitis.  Cymbalta  is unlikely to cause severe pain, but anxiety may contribute.  She  is of the right demographic for biliary colic or choledocholithiasis. Blood work and an ultrasound of the gallbladder are ordered. Prescribed Protonix  1 tablet once daily and temporarily hold Cymbalta . Schedule a follow-up in four weeks.  Patient counseled if she were to develop worsening nausea, vomiting, food intolerance, to go to the emergency department for hydration.  I am somewhat reassured that she had a CT scan done  just 3 months ago in the emergency department for abdominal pain which was structurally normal.      Relevant Medications   pantoprazole  (PROTONIX ) 40 MG tablet   Other Relevant Orders   CBC with Differential/Platelet   Comprehensive metabolic panel with GFR   Lipase   EKG 12-Lead   US  Abdomen Limited RUQ (LIVER/GB)    Meds ordered this encounter  Medications   hydrOXYzine  (VISTARIL ) 25 MG capsule    Sig: Take 1 capsule (25 mg total) by mouth every 8 (eight) hours as needed.    Dispense:  30 capsule    Refill:  1   pantoprazole  (PROTONIX ) 40 MG tablet    Sig: Take 1 tablet (40 mg total) by mouth daily.    Dispense:  30 tablet    Refill:  3    Return in about 4 weeks (around 01/20/2025).  Cleatus Debby Specking, MD [facility]   "

## 2024-12-23 NOTE — Assessment & Plan Note (Signed)
 Anxiety may contribute to nausea and epigastric pain.  Her symptoms this morning sound very consistent with a panic attack.  EKG is reassuring.  Cymbalta  is effective for anxiety but has been holding it for 2 weeks because of nausea. Prescribed hydroxyzine  as needed for acute symptoms. Plan to restart Cymbalta  once symptoms improve.  If she cannot tolerate going back to Cymbalta , we could try a different SSRI.

## 2024-12-24 LAB — CBC WITH DIFFERENTIAL/PLATELET
Absolute Lymphocytes: 2880 {cells}/uL (ref 850–3900)
Absolute Monocytes: 530 {cells}/uL (ref 200–950)
Basophils Absolute: 40 {cells}/uL (ref 0–200)
Basophils Relative: 0.4 %
Eosinophils Absolute: 80 {cells}/uL (ref 15–500)
Eosinophils Relative: 0.8 %
HCT: 45.7 % (ref 35.9–46.0)
Hemoglobin: 15.4 g/dL (ref 11.7–15.5)
MCH: 30.6 pg (ref 27.0–33.0)
MCHC: 33.7 g/dL (ref 31.6–35.4)
MCV: 90.7 fL (ref 81.4–101.7)
MPV: 11 fL (ref 7.5–12.5)
Monocytes Relative: 5.3 %
Neutro Abs: 6470 {cells}/uL (ref 1500–7800)
Neutrophils Relative %: 64.7 %
Platelets: 320 Thousand/uL (ref 140–400)
RBC: 5.04 Million/uL (ref 3.80–5.10)
RDW: 12.7 % (ref 11.0–15.0)
Total Lymphocyte: 28.8 %
WBC: 10 Thousand/uL (ref 3.8–10.8)

## 2024-12-24 LAB — COMPREHENSIVE METABOLIC PANEL WITH GFR
AG Ratio: 1.3 (calc) (ref 1.0–2.5)
ALT: 14 U/L (ref 6–29)
AST: 14 U/L (ref 10–30)
Albumin: 4.6 g/dL (ref 3.6–5.1)
Alkaline phosphatase (APISO): 78 U/L (ref 31–125)
BUN: 11 mg/dL (ref 7–25)
CO2: 25 mmol/L (ref 20–32)
Calcium: 9.7 mg/dL (ref 8.6–10.2)
Chloride: 100 mmol/L (ref 98–110)
Creat: 0.66 mg/dL (ref 0.50–0.97)
Globulin: 3.6 g/dL (ref 1.9–3.7)
Glucose, Bld: 86 mg/dL (ref 65–99)
Potassium: 4.1 mmol/L (ref 3.5–5.3)
Sodium: 135 mmol/L (ref 135–146)
Total Bilirubin: 0.6 mg/dL (ref 0.2–1.2)
Total Protein: 8.2 g/dL — ABNORMAL HIGH (ref 6.1–8.1)
eGFR: 116 mL/min/1.73m2

## 2024-12-24 LAB — LIPASE: Lipase: 14 U/L (ref 7–60)

## 2024-12-26 ENCOUNTER — Ambulatory Visit: Payer: Self-pay | Admitting: Student in an Organized Health Care Education/Training Program

## 2025-01-17 ENCOUNTER — Ambulatory Visit
Admission: RE | Admit: 2025-01-17 | Discharge: 2025-01-17 | Disposition: A | Source: Ambulatory Visit | Attending: Student in an Organized Health Care Education/Training Program

## 2025-01-17 DIAGNOSIS — R1013 Epigastric pain: Secondary | ICD-10-CM

## 2025-01-23 ENCOUNTER — Ambulatory Visit: Admitting: Student in an Organized Health Care Education/Training Program
